# Patient Record
Sex: Female | Born: 1985 | Race: Black or African American | Hispanic: No | Marital: Single | Smoking: Current every day smoker
Health system: Southern US, Community
[De-identification: ages and names within clinical notes are randomized; demographics above are authoritative.]

## PROBLEM LIST (undated history)

## (undated) ENCOUNTER — Inpatient Hospital Stay (HOSPITAL_COMMUNITY): Payer: Self-pay

## (undated) DIAGNOSIS — L732 Hidradenitis suppurativa: Secondary | ICD-10-CM

## (undated) DIAGNOSIS — G43909 Migraine, unspecified, not intractable, without status migrainosus: Secondary | ICD-10-CM

## (undated) DIAGNOSIS — R87629 Unspecified abnormal cytological findings in specimens from vagina: Secondary | ICD-10-CM

## (undated) DIAGNOSIS — A749 Chlamydial infection, unspecified: Secondary | ICD-10-CM

## (undated) HISTORY — PX: NO PAST SURGERIES: SHX2092

## (undated) HISTORY — DX: Migraine, unspecified, not intractable, without status migrainosus: G43.909

## (undated) HISTORY — PX: TUBAL LIGATION: SHX77

---
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
## 2007-07-28 ENCOUNTER — Emergency Department (HOSPITAL_COMMUNITY): Admission: EM | Admit: 2007-07-28 | Discharge: 2007-07-28 | Payer: Self-pay | Admitting: Emergency Medicine

## 2007-07-31 ENCOUNTER — Emergency Department (HOSPITAL_COMMUNITY): Admission: EM | Admit: 2007-07-31 | Discharge: 2007-07-31 | Payer: Self-pay | Admitting: Emergency Medicine

## 2007-11-06 ENCOUNTER — Emergency Department (HOSPITAL_COMMUNITY): Admission: EM | Admit: 2007-11-06 | Discharge: 2007-11-06 | Payer: Self-pay | Admitting: Emergency Medicine

## 2008-09-02 ENCOUNTER — Emergency Department (HOSPITAL_COMMUNITY): Admission: EM | Admit: 2008-09-02 | Discharge: 2008-09-02 | Payer: Self-pay | Admitting: Emergency Medicine

## 2009-01-11 IMAGING — CR DG CHEST 2V
2 series · 2 of 2 positions shown · non-contrast
Comparison: None.

CLINICAL DATA: Chest pressure and shortness of breath.
 CHEST - 2 VIEW:

[view not recorded (1 of 2)]
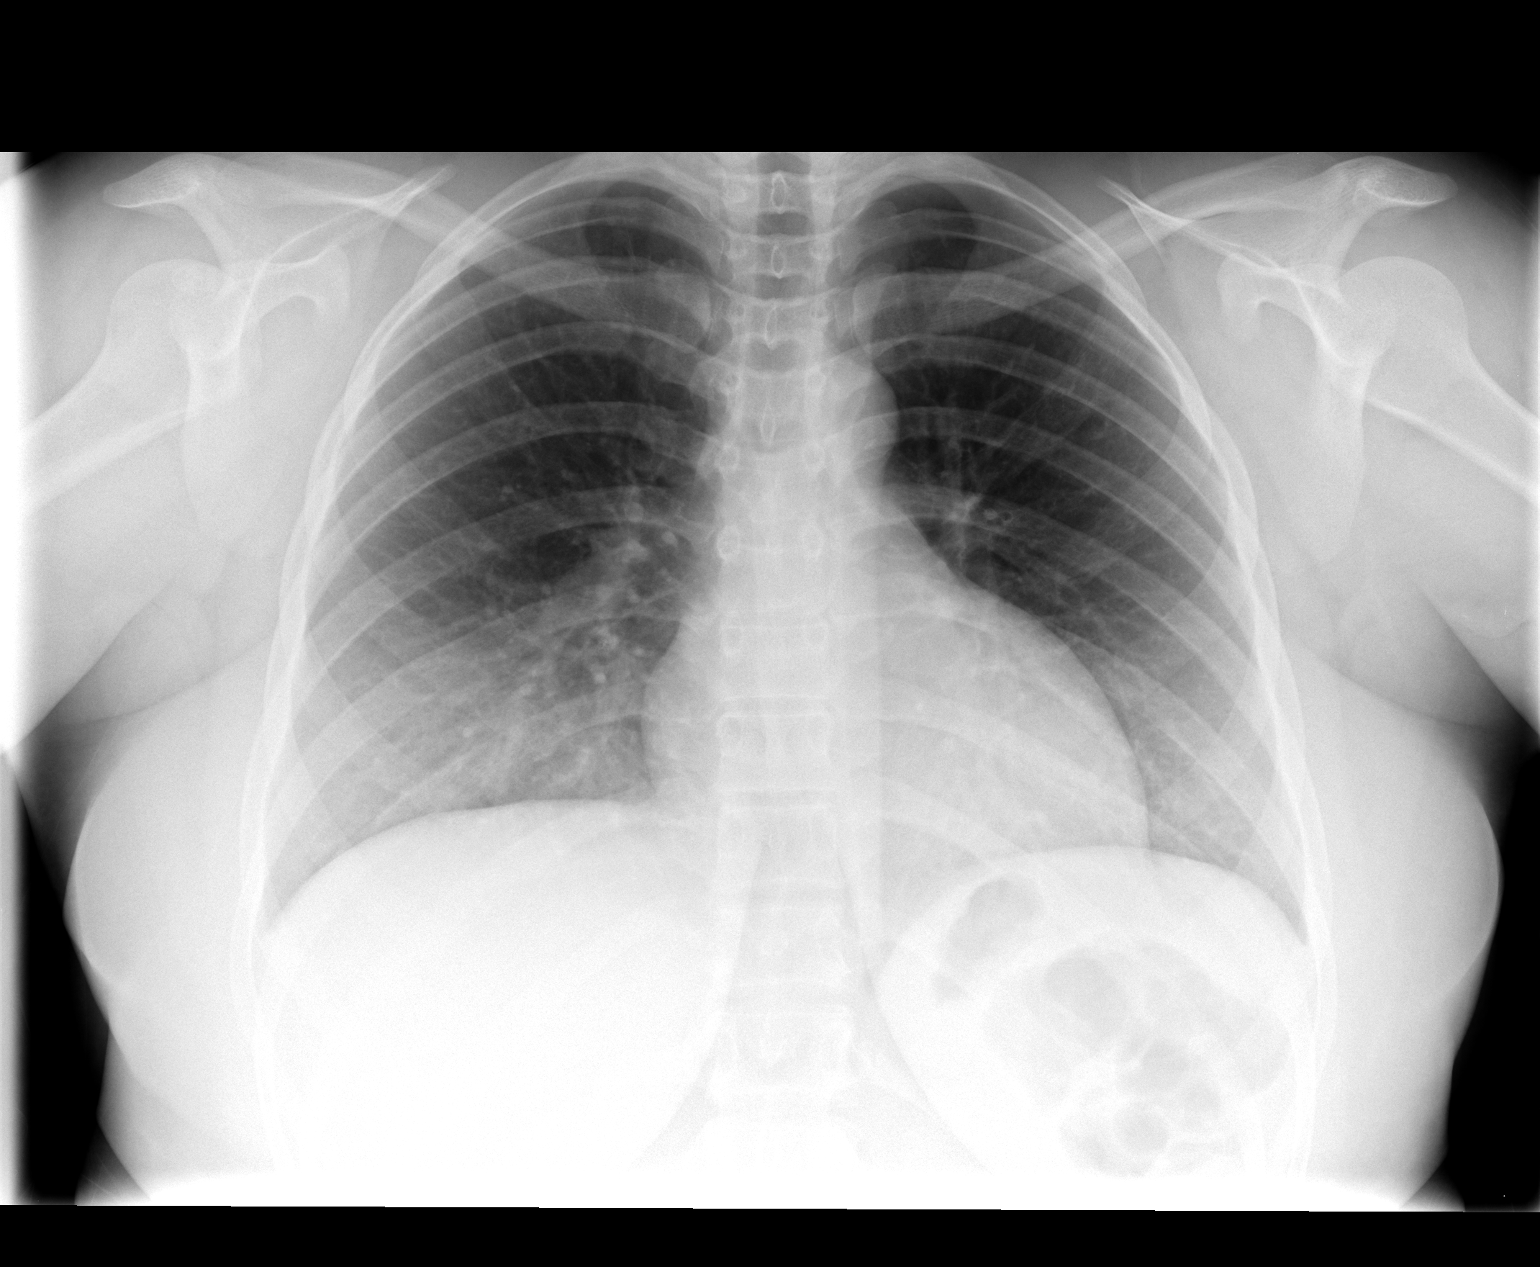

[view not recorded (2 of 2)]
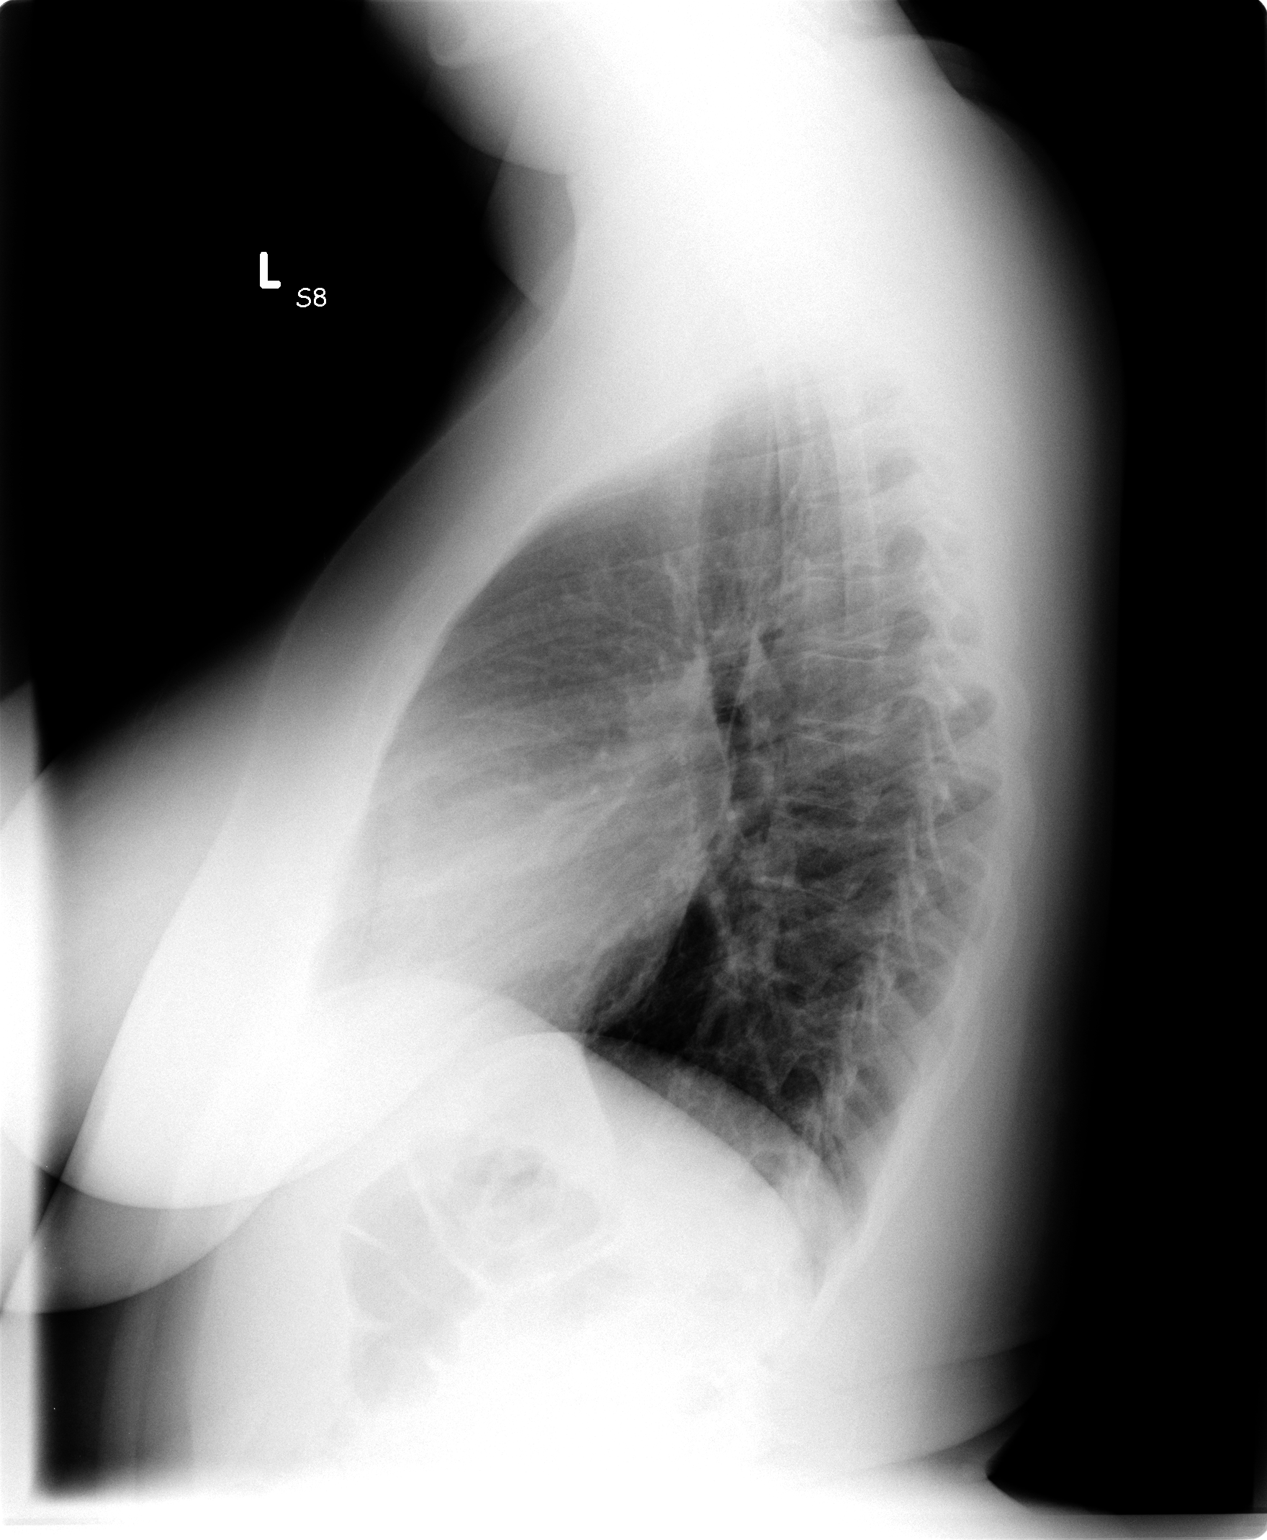

[2 of 2 positions shown; findings below may reference images not displayed]

FINDINGS: Relative low level of inspiration.  Heart and lungs normal.  No pleural fluid or osseous lesions.
IMPRESSION: No active disease.

## 2009-02-09 ENCOUNTER — Emergency Department (HOSPITAL_COMMUNITY): Admission: EM | Admit: 2009-02-09 | Discharge: 2009-02-09 | Payer: Self-pay | Admitting: Emergency Medicine

## 2009-03-16 ENCOUNTER — Emergency Department (HOSPITAL_COMMUNITY): Admission: EM | Admit: 2009-03-16 | Discharge: 2009-03-16 | Payer: Self-pay | Admitting: Emergency Medicine

## 2009-04-25 ENCOUNTER — Emergency Department (HOSPITAL_COMMUNITY): Admission: EM | Admit: 2009-04-25 | Discharge: 2009-04-25 | Payer: Self-pay | Admitting: Emergency Medicine

## 2010-02-22 ENCOUNTER — Emergency Department (HOSPITAL_COMMUNITY): Admission: EM | Admit: 2010-02-22 | Discharge: 2010-02-22 | Payer: Self-pay | Admitting: Emergency Medicine

## 2010-09-14 ENCOUNTER — Emergency Department (HOSPITAL_COMMUNITY)
Admission: EM | Admit: 2010-09-14 | Discharge: 2010-09-15 | Payer: Self-pay | Source: Home / Self Care | Admitting: Occupational Therapy

## 2010-11-18 ENCOUNTER — Inpatient Hospital Stay (INDEPENDENT_AMBULATORY_CARE_PROVIDER_SITE_OTHER)
Admission: RE | Admit: 2010-11-18 | Discharge: 2010-11-18 | Disposition: A | Discharge status: Home / Self Care | Payer: Self-pay | Source: Ambulatory Visit | Attending: Family Medicine | Admitting: Family Medicine

## 2010-11-18 DIAGNOSIS — J309 Allergic rhinitis, unspecified: Secondary | ICD-10-CM

## 2010-11-18 LAB — PREGNANCY, URINE: Preg Test, Ur: NEGATIVE

## 2010-11-18 LAB — WET PREP, GENITAL: Yeast Wet Prep HPF POC: NONE SEEN

## 2010-11-18 LAB — URINALYSIS, ROUTINE W REFLEX MICROSCOPIC
Glucose, UA: NEGATIVE mg/dL
Hgb urine dipstick: NEGATIVE
Ketones, ur: 15 mg/dL — AB
Nitrite: NEGATIVE
Protein, ur: NEGATIVE mg/dL
Specific Gravity, Urine: 1.036 — ABNORMAL HIGH (ref 1.005–1.030)
Urobilinogen, UA: 1 mg/dL (ref 0.0–1.0)

## 2010-11-18 LAB — GC/CHLAMYDIA PROBE AMP, GENITAL
Chlamydia, DNA Probe: NEGATIVE
GC Probe Amp, Genital: NEGATIVE

## 2010-12-10 LAB — RAPID STREP SCREEN (MED CTR MEBANE ONLY): Streptococcus, Group A Screen (Direct): NEGATIVE

## 2010-12-17 LAB — URINALYSIS, ROUTINE W REFLEX MICROSCOPIC
Ketones, ur: NEGATIVE mg/dL
Nitrite: NEGATIVE
Protein, ur: NEGATIVE mg/dL
Specific Gravity, Urine: 1.015 (ref 1.005–1.030)

## 2010-12-17 LAB — PREGNANCY, URINE: Preg Test, Ur: NEGATIVE

## 2010-12-17 LAB — WET PREP, GENITAL

## 2011-01-18 NOTE — Op Note (Signed)
NAME:  Christy Holden, Christy Holden NO.:  [id number]   MEDICAL RECORD NO.:  [id number]          PATIENT TYPE:  INP   LOCATION:  LDR2                          FACILITY:  [facility]   PHYSICIAN:  Lazaro Arms, M.D.   DATE OF BIRTH:  [birth year]   DATE OF PROCEDURE:  10/01/2006  DATE OF DISCHARGE:                               OPERATIVE REPORT   PROCEDURE:  Epidural placement.   SURGEON:  Lazaro Arms, M.D.   INDICATIONS FOR PROCEDURE:  Christy Holden is an 25 year old Gravida 1, para 0  who has experienced ruptured membranes without labor.  She has been  begun on Pitocin for the induction of labor.  Her cervix is now 4 cm and  she is requesting an epidural.   DESCRIPTION OF PROCEDURE:  The patient is placed in a sitting position.  Betadine prep is used.  The L3-L4 interspace is injected with 1%  lidocaine as a local anesthetic.  The area is still draped.  A #17 gauge  Tuohy needle is used and a loss of resistance technique employed, and  the epidural space is found with one pass, without difficulty.  Then 10  mL of 0.125% bupivacaine plain is given without difficulty and without  ill effects.  The epidural catheter is then fed, but it feds directly  into a blood vessel.  It is cleared and then backed up.  I re-fed it and  it fed into a blood vessel again.  As a result, I removed the #17 gauge  Tuohy needle once again and found the epidural space using a loss of  resistance technique with one pass.  I fed the epidural catheter this  time and it did not find a blood vessel.  I then gave 10 mL more of  0.125% bupivacaine through the epidural catheter, again without ill  effects.  The epidural catheter was taped down 5 cm from the epidural  space with continuous infusion of 0.125% bupivacaine with 2 mcg per mL  of fentanyl begun at 12 mL an hour.  The patient is getting good pain  relief.  The fetal heart rate tracing is stable.  The blood pressure is  stable.      Lazaro Arms,  M.D.  Electronically Signed     LHE/MEDQ  D:  10/01/2006  T:  10/01/2006  Job:  [id number]

## 2011-01-18 NOTE — Op Note (Signed)
NAME:  Christy Holden, Christy Holden NO.:  [id number]   MEDICAL RECORD NO.:  [id number]          PATIENT TYPE:  INP   LOCATION:  LDR2                          FACILITY:  [facility]   PHYSICIAN:  Lazaro Arms, M.D.   DATE OF BIRTH:  [birth year]   DATE OF PROCEDURE:  DATE OF DISCHARGE:                               OPERATIVE REPORT   Onset of labor was October 01, 2006, at 7 a.m.  Length of first stage  labor was 4 hours.  Length of second stage labor was 1 hour.  Length of  third stage labor was 5 minutes.   DELIVERY NOTE:  [name] had a normal spontaneous vaginal delivery of a  viable female infant with Apgars of 9 and 9.  Upon delivery of infant,  she was thoroughly suctioned, cord clamped and cut, and placed on  mother's abdomen for newborn care.  Third stage of labor was actually  managed with 20 units Pitocin 1000 mL D5 lactated Ringers at a rapid  rate.  Placenta was delivered spontaneously.  _membranes intact__.  Cord  blood gas and cord blood were obtained.  Membranes were noted to be  intact upon inspection.  Perineum was noted to be intact on inspection.  Estimated blood loss was approximately 350 mL.  Epidural catheter was  removed with blue tip intact.      Zerita Boers, N.M.      Lazaro Arms, M.D.  Electronically Signed    DL/MEDQ  D:  04/54/0981  T:  10/01/2006  Job:  [id number]

## 2011-01-18 NOTE — H&P (Signed)
NAME:  TARAYA, STEWARD NO.:  [id number]   MEDICAL RECORD NO.:  [id number]          PATIENT TYPE:  INP   LOCATION:  LDR2                          FACILITY:  [facility]   PHYSICIAN:  Lazaro Arms, M.D.   DATE OF BIRTH:  03-24-1986   DATE OF ADMISSION:  09/30/2006  DATE OF DISCHARGE:  LH                              HISTORY & PHYSICAL   CHIEF COMPLAINT:  Premature rupture of membranes.   HISTORY OF PRESENT ILLNESS:  Shell is an 25 year old gravida 1, para 0  with an EDC of October 18, 2006, placing her at 36 weeks 6 days  gestation.  She began prenatal care in her first trimester and has had  regular visits since then.  Her prenatal course was complicated by early  Chlamydia infection which was treated and proof of care was negative.   PRENATAL LABORATORIES:  Blood type O+, rubella immune.  HB, SAG, HIV,  RPR, HSV, gonorrhea and sickle cell are all negative.  She is positive  for group B strep.  MSAFP is within normal limits.  Blood pressures have  been anywhere from 100-150s/70s to 80s even in the first trimester.  Her  blood pressures here in the hospital are in the 140/80 range.  She has  had a total weight gain of about 20 pounds with appropriate fundal  height growth.   PAST MEDICAL HISTORY:  Noncontributory.   SURGICAL HISTORY:  None.   ALLERGIES:  No known drug allergies.   SOCIAL HISTORY:  Denies cigarette smoking, alcohol or drug use.  UDS was  negative.  She is single, graduated from [employer] and  lives with her aunt.   FAMILY HISTORY:  Noncontributory.   PHYSICAL EXAMINATION:  HEENT: Within normal limits.  HEART: Regular rate and rhythm.  LUNGS:  Clear.  ABDOMEN:  Soft and nontender.  She is having some mild and infrequent  uterine contractions at this time.  Her fetal heart rate is reactive  without decelerations.  Cervical exam is 3, 50, -2, clear amniotic  fluid.  Legs are negative.   IMPRESSION:  IUP at 37 weeks, premature  rupture of membranes.   PLAN:  We have already started group B strep prophylaxis.  If she was  not kick into good labor by the morning, will start on Pitocin  augmentation.      Jacklyn Shell, C.N.M.      Lazaro Arms, M.D.  Electronically Signed    FC/MEDQ  D:  09/30/2006  T:  10/01/2006  Job:  [id number]   cc:   [facility]   Scott A. Gerda Diss, MD  Fax: [phone]

## 2011-01-26 ENCOUNTER — Emergency Department (HOSPITAL_COMMUNITY): Payer: Self-pay

## 2011-01-26 ENCOUNTER — Emergency Department (HOSPITAL_COMMUNITY)
Admission: EM | Admit: 2011-01-26 | Discharge: 2011-01-27 | Disposition: A | Discharge status: Home / Self Care | Payer: Self-pay | Attending: Emergency Medicine | Admitting: Emergency Medicine

## 2011-01-26 DIAGNOSIS — M25579 Pain in unspecified ankle and joints of unspecified foot: Secondary | ICD-10-CM | POA: Insufficient documentation

## 2011-01-26 DIAGNOSIS — X58XXXA Exposure to other specified factors, initial encounter: Secondary | ICD-10-CM | POA: Insufficient documentation

## 2011-01-26 DIAGNOSIS — Y9302 Activity, running: Secondary | ICD-10-CM | POA: Insufficient documentation

## 2011-01-26 DIAGNOSIS — S93409A Sprain of unspecified ligament of unspecified ankle, initial encounter: Secondary | ICD-10-CM | POA: Insufficient documentation

## 2011-06-11 LAB — DIFFERENTIAL
Basophils Absolute: 0
Eosinophils Absolute: 0.5
Eosinophils Relative: 7 — ABNORMAL HIGH
Lymphocytes Relative: 32
Lymphs Abs: 2.3
Monocytes Relative: 7
Neutro Abs: 3.9
Neutrophils Relative %: 54

## 2011-06-11 LAB — CBC
MCV: 87.5
Platelets: 342

## 2011-06-11 LAB — D-DIMER, QUANTITATIVE: D-Dimer, Quant: 0.22

## 2012-11-10 ENCOUNTER — Emergency Department (HOSPITAL_COMMUNITY)
Admission: EM | Admit: 2012-11-10 | Discharge: 2012-11-10 | Disposition: A | Discharge status: Home / Self Care | Payer: Self-pay | Attending: Emergency Medicine | Admitting: Emergency Medicine

## 2012-11-10 ENCOUNTER — Encounter (HOSPITAL_COMMUNITY): Payer: Self-pay | Admitting: Physical Medicine and Rehabilitation

## 2012-11-10 DIAGNOSIS — R21 Rash and other nonspecific skin eruption: Secondary | ICD-10-CM | POA: Insufficient documentation

## 2012-11-10 DIAGNOSIS — L299 Pruritus, unspecified: Secondary | ICD-10-CM | POA: Insufficient documentation

## 2012-11-10 DIAGNOSIS — F17200 Nicotine dependence, unspecified, uncomplicated: Secondary | ICD-10-CM | POA: Insufficient documentation

## 2012-11-10 DIAGNOSIS — L282 Other prurigo: Secondary | ICD-10-CM

## 2012-11-10 NOTE — ED Notes (Signed)
Pt presents to department for evaluation of rash. Small red areas all over body. Pt states she noticed these after sleeping on friends couch. Pt states severe itching to red areas. Pt is alert and oriented x4. Respirations unlabored.

## 2012-11-10 NOTE — ED Provider Notes (Signed)
History     CSN: [id number]  Arrival date & time 11/10/12  1822   None     Chief Complaint  Patient presents with  . Rash    (Consider location/radiation/quality/duration/timing/severity/associated sxs/prior treatment) Patient is a 27 y.o. female presenting with rash. The history is provided by the patient. No language interpreter was used.  Rash Location:  Full body Quality: itchiness and redness   Quality: not blistering, not draining, not scaling and not weeping   Severity:  Moderate Onset quality:  Sudden Duration:  1 day Timing:  Intermittent Progression:  Waxing and waning Chronicity:  New Worsened by:  Nothing tried Ineffective treatments:  None tried   No past medical history on file.  No past surgical history on file.  No family history on file.  History  Substance Use Topics  . Smoking status: Current Every Day Smoker    Types: Cigarettes  . Smokeless tobacco: Not on file  . Alcohol Use: No    OB History   Grav Para Term Preterm Abortions TAB SAB Ect Mult Living                  Review of Systems  Skin: Positive for rash.  All other systems reviewed and are negative.    Allergies  Review of patient's allergies indicates no known allergies.  Home Medications  No current outpatient prescriptions on file.  BP 135/78  Pulse 79  Temp(Src) 98.9 F (37.2 C) (Oral)  Resp 16  SpO2 100%  Physical Exam  Nursing note and vitals reviewed. Constitutional: She is oriented to person, place, and time. She appears well-developed and well-nourished.  HENT:  Head: Normocephalic and atraumatic.  Eyes: Pupils are equal, round, and reactive to light.  Neck: Normal range of motion.  Cardiovascular: Normal rate.   Pulmonary/Chest: Effort normal and breath sounds normal.  Abdominal: Soft. Bowel sounds are normal.  Musculoskeletal: Normal range of motion.  Neurological: She is alert and oriented to person, place, and time.  Skin: Skin is warm and dry.  Rash noted.  Several areas of red, pruritic rash on arms, legs, left foot--suspect insect bites.  Psychiatric: She has a normal mood and affect. Her behavior is normal. Judgment and thought content normal.    ED Course  Procedures (including critical care time)  Labs Reviewed - No data to display No results found.   No diagnosis found.  Pruritic rash.  Benadryl and hydrocortisone cream.  MDM          Jimmye Norman, NP 11/11/12 0007

## 2012-11-12 NOTE — ED Provider Notes (Signed)
Medical screening examination/treatment/procedure(s) were performed by non-physician practitioner and as supervising physician I was immediately available for consultation/collaboration.  Raeford Razor, MD 11/12/12 [phone]

## 2013-05-25 ENCOUNTER — Emergency Department (INDEPENDENT_AMBULATORY_CARE_PROVIDER_SITE_OTHER)
Admission: EM | Admit: 2013-05-25 | Discharge: 2013-05-25 | Disposition: A | Discharge status: Home / Self Care | Payer: 59 | Source: Home / Self Care

## 2013-05-25 ENCOUNTER — Other Ambulatory Visit (HOSPITAL_COMMUNITY)
Admission: RE | Admit: 2013-05-25 | Discharge: 2013-05-25 | Disposition: A | Discharge status: Home / Self Care | Payer: 59 | Source: Ambulatory Visit | Attending: Emergency Medicine | Admitting: Emergency Medicine

## 2013-05-25 ENCOUNTER — Encounter (HOSPITAL_COMMUNITY): Payer: Self-pay | Admitting: Emergency Medicine

## 2013-05-25 DIAGNOSIS — N730 Acute parametritis and pelvic cellulitis: Secondary | ICD-10-CM

## 2013-05-25 DIAGNOSIS — Z113 Encounter for screening for infections with a predominantly sexual mode of transmission: Secondary | ICD-10-CM | POA: Insufficient documentation

## 2013-05-25 DIAGNOSIS — N760 Acute vaginitis: Secondary | ICD-10-CM | POA: Insufficient documentation

## 2013-05-25 DIAGNOSIS — K5900 Constipation, unspecified: Secondary | ICD-10-CM

## 2013-05-25 DIAGNOSIS — R102 Pelvic and perineal pain: Secondary | ICD-10-CM

## 2013-05-25 DIAGNOSIS — N949 Unspecified condition associated with female genital organs and menstrual cycle: Secondary | ICD-10-CM

## 2013-05-25 LAB — POCT URINALYSIS DIP (DEVICE)
Glucose, UA: NEGATIVE mg/dL
Specific Gravity, Urine: 1.015 (ref 1.005–1.030)
Urobilinogen, UA: 0.2 mg/dL (ref 0.0–1.0)
pH: 7 (ref 5.0–8.0)

## 2013-05-25 MED ORDER — METRONIDAZOLE 500 MG PO TABS: 500.0000 mg | ORAL_TABLET | Freq: Two times a day (BID) | ORAL | Status: DC

## 2013-05-25 MED ORDER — LIDOCAINE HCL (PF) 1 % IJ SOLN
INTRAMUSCULAR | Status: AC
  Filled 2013-05-25: qty 5

## 2013-05-25 MED ORDER — CEFTRIAXONE SODIUM 1 G IJ SOLR
INTRAMUSCULAR | Status: AC
  Filled 2013-05-25: qty 10

## 2013-05-25 MED ORDER — CEFTRIAXONE SODIUM 250 MG IJ SOLR
250.0000 mg | Freq: Once | INTRAMUSCULAR | Status: AC
  Administered 2013-05-25: 250 mg via INTRAMUSCULAR

## 2013-05-25 MED ORDER — AZITHROMYCIN 250 MG PO TABS: ORAL_TABLET | ORAL | Status: DC

## 2013-05-25 NOTE — ED Notes (Signed)
C/o lower abdominal pain since 9/21.  Denies urinary symptoms and discharge.  Pt states that she has not had a BM in the past three days.  Last stool was normal.   Denies any other symptoms.

## 2013-05-25 NOTE — ED Provider Notes (Signed)
Medical screening examination/treatment/procedure(s) were performed by non-physician practitioner and as supervising physician I was immediately available for consultation/collaboration.  Jeffrey Graefe, M.D.  Raiford Fetterman C Piercen Covino, MD 05/25/13 1953 

## 2013-05-25 NOTE — ED Provider Notes (Signed)
CSN: [id number]     Arrival date & time 05/25/13  1722 History   First MD Initiated Contact with Patient 05/25/13 1755     Chief Complaint  Patient presents with  . Abdominal Pain    lower abdominal pain since 9/21   (Consider location/radiation/quality/duration/timing/severity/associated sxs/prior Treatment) HPI Comments: -year-old female presents with low abdominal/pelvic pain described as crampy for at least 2 days. She states the pain is sharp, comes and goes and is episodic. It is not constant. She states that since she quit smoking 3 weeks ago that she is developed constipation. She does not have regular bowel movements as she used to. She denies vaginal discharge or bleeding, urinary symptoms, nausea, vomiting, diarrhea. Last menstrual period was one week ago but she states that she is managed with Implanon and often does not have regular periods.   History reviewed. No pertinent past medical history. History reviewed. No pertinent past surgical history. History reviewed. No pertinent family history. History  Substance Use Topics  . Smoking status: Current Every Day Smoker    Types: Cigarettes  . Smokeless tobacco: Not on file  . Alcohol Use: No   OB History   Grav Para Term Preterm Abortions TAB SAB Ect Mult Living                 Review of Systems  Constitutional: Positive for appetite change. Negative for fever, activity change and fatigue.  HENT: Negative.   Respiratory: Negative.   Cardiovascular: Negative.   Gastrointestinal: Positive for abdominal pain and constipation. Negative for nausea, vomiting, diarrhea, blood in stool and abdominal distention.  Genitourinary: Negative for dysuria, urgency, frequency, flank pain, vaginal bleeding, vaginal discharge and vaginal pain.  Musculoskeletal: Negative.   Skin: Negative.   Neurological: Negative.     Allergies  Review of patient's allergies indicates no known allergies.  Home Medications   Current Outpatient Rx   Name  Route  Sig  Dispense  Refill  . azithromycin (ZITHROMAX) 250 MG tablet      Take all 4 tabs stat   4 tablet   0   . etonogestrel (IMPLANON) 68 MG IMPL implant   Subcutaneous   Inject 1 each into the skin once.         . metroNIDAZOLE (FLAGYL) 500 MG tablet   Oral   Take 1 tablet (500 mg total) by mouth 2 (two) times daily. X 7 days   14 tablet   0    BP 128/80  Pulse 72  Temp(Src) 99.1 F (37.3 C) (Oral)  Resp 16  SpO2 99% Physical Exam  Nursing note and vitals reviewed. Constitutional: She is oriented to person, place, and time. She appears well-developed and well-nourished. No distress.  Eyes: EOM are normal.  Neck: Normal range of motion. Neck supple.  Cardiovascular: Normal rate, regular rhythm, normal heart sounds and intact distal pulses.   Pulmonary/Chest: Effort normal and breath sounds normal. No respiratory distress. She has no wheezes. She has no rales.  Abdominal: Soft. She exhibits no distension and no mass. There is tenderness. There is no rebound and no guarding.  Minor tenderness across the lower most abdomen. Tenderness in the right and left pelvis.  Genitourinary: Vaginal discharge found.  Normal external female genitalia the exception of moderate amount of bradycardia discharge exuding from the vagina. The vaginal vault is filled with thin gray discharge. Cervix was captured it is left of midline and anterior. The ectocervix is erythematous , friable and inflamed. No CMT, positive  right adnexal tenderness, negative left adnexal tenderness. A second evaluation of the lower abdomen reveals no abdominal tenderness.   Musculoskeletal: She exhibits no edema and no tenderness.  Neurological: She is alert and oriented to person, place, and time. She exhibits normal muscle tone.  Skin: Skin is warm and dry.  Psychiatric: She has a normal mood and affect.    ED Course  Procedures (including critical care time) Labs Review Labs Reviewed  POCT  URINALYSIS DIP (DEVICE) - Abnormal; Notable for the following:    Hgb urine dipstick TRACE (*)    Leukocytes, UA TRACE (*)    All other components within normal limits  POCT PREGNANCY, URINE  CERVICOVAGINAL ANCILLARY ONLY   Imaging Review No results found. Results for orders placed during the hospital encounter of 05/25/13  POCT URINALYSIS DIP (DEVICE)      Result Value Range   Glucose, UA NEGATIVE  NEGATIVE mg/dL   Bilirubin Urine NEGATIVE  NEGATIVE   Ketones, ur NEGATIVE  NEGATIVE mg/dL   Specific Gravity, Urine 1.015  1.005 - 1.030   Hgb urine dipstick TRACE (*) NEGATIVE   pH 7.0  5.0 - 8.0   Protein, ur NEGATIVE  NEGATIVE mg/dL   Urobilinogen, UA 0.2  0.0 - 1.0 mg/dL   Nitrite NEGATIVE  NEGATIVE   Leukocytes, UA TRACE (*) NEGATIVE  POCT PREGNANCY, URINE      Result Value Range   Preg Test, Ur NEGATIVE  NEGATIVE    MDM   1. PID (acute pelvic inflammatory disease)   2. Constipation   3. Pelvic pain     For potential constipation may take MiraLax as directed. Drink plenty of fluids and increase fiber in your diet. For  the pelvic infection administer Rocephin 250 mg IM Rx for azithromycin 1 g by mouth Rx for Flagyl 500 mg twice a day. The discharge is consistent with BV.  Hayden Rasmussen, NP 05/25/13 [phone]

## 2013-05-25 NOTE — ED Notes (Signed)
Pt given injection will discharge at 7:10 p.m

## 2013-06-02 NOTE — ED Notes (Signed)
GC/Chlamydia neg., Affirm: Candida and Trich neg., Gardnerella pos.  Pt. adequately treated with Flagyl. Priscilla Finklea M 06/02/2013  

## 2013-06-04 ENCOUNTER — Encounter: Payer: Self-pay | Admitting: Family Medicine

## 2013-06-04 ENCOUNTER — Ambulatory Visit (INDEPENDENT_AMBULATORY_CARE_PROVIDER_SITE_OTHER): Payer: 59 | Admitting: Family Medicine

## 2013-06-04 VITALS — BP 120/80 | HR 68 | Temp 97.2°F | Resp 18 | Ht 64.0 in | Wt 195.0 lb

## 2013-06-04 DIAGNOSIS — B9689 Other specified bacterial agents as the cause of diseases classified elsewhere: Secondary | ICD-10-CM

## 2013-06-04 DIAGNOSIS — Z23 Encounter for immunization: Secondary | ICD-10-CM

## 2013-06-04 DIAGNOSIS — N760 Acute vaginitis: Secondary | ICD-10-CM

## 2013-06-04 DIAGNOSIS — A499 Bacterial infection, unspecified: Secondary | ICD-10-CM

## 2013-06-04 DIAGNOSIS — Z0000 Encounter for general adult medical examination without abnormal findings: Secondary | ICD-10-CM

## 2013-06-04 DIAGNOSIS — E669 Obesity, unspecified: Secondary | ICD-10-CM

## 2013-06-04 NOTE — Patient Instructions (Addendum)
Release of [facility]- Need last PAP Smear included  Get the flagyl and complete course  Tetanus Booster and FLU  F/U as needed

## 2013-06-04 NOTE — Progress Notes (Signed)
  Subjective:    Patient ID: Christy Holden, female    DOB: 1985/09/29, 27 y.o.   MRN: [id number]  HPI  Pt here to establish care, previous PCP [facility].  No specific concerns, had  PAP and fasting labs this year in March told everything was normal Recently seen at [facility], had pelvic pain diagnosed with PID, cultures neg with exception of BV, she never took the flagyl still has script in care. Plans to enter [employer], she has been trying to lose weight. Works out a few times a week, also cutting back on fastfood and junk food  OCP- has implanon in her arm, she is having this taken out, when she had previously she gained 40 pounds, she wants to see if removing it will help with weight loss again   Review of Systems   GEN- denies fatigue, fever, weight loss,weakness, recent illness HEENT- denies eye drainage, change in vision, nasal discharge, CVS- denies chest pain, palpitations RESP- denies SOB, cough, wheeze ABD- denies N/V, change in stools, abd pain GU- denies dysuria, hematuria, dribbling, incontinence MSK- denies joint pain, muscle aches, injury Neuro- denies headache, dizziness, syncope, seizure activity      Objective:   Physical Exam GEN- NAD, alert and oriented x3 HEENT- PERRL, EOMI, non injected sclera, pink conjunctiva, MMM, oropharynx clear, TM bilaterally  Neck- Supple, no thyromegaly CVS- RRR, no murmur RESP-CTAB ABD-NABS,NT,ND, Soft EXT- No edema Pulses- Radial, DP- 2+        Assessment & Plan:   CPE- Will obtain fasting labs and last PAP smear for health department.  Flu and TDAP given today

## 2013-06-06 ENCOUNTER — Encounter: Payer: Self-pay | Admitting: Family Medicine

## 2013-06-06 DIAGNOSIS — E669 Obesity, unspecified: Secondary | ICD-10-CM | POA: Insufficient documentation

## 2013-06-06 DIAGNOSIS — B9689 Other specified bacterial agents as the cause of diseases classified elsewhere: Secondary | ICD-10-CM | POA: Insufficient documentation

## 2013-06-06 NOTE — Assessment & Plan Note (Signed)
Recent diagnosis advised to start the flagyl

## 2013-07-05 ENCOUNTER — Encounter (HOSPITAL_COMMUNITY): Payer: Self-pay | Admitting: Emergency Medicine

## 2013-07-05 ENCOUNTER — Emergency Department (HOSPITAL_COMMUNITY)
Admission: EM | Admit: 2013-07-05 | Discharge: 2013-07-06 | Disposition: A | Discharge status: Home / Self Care | Payer: 59 | Attending: Emergency Medicine | Admitting: Emergency Medicine

## 2013-07-05 DIAGNOSIS — I10 Essential (primary) hypertension: Secondary | ICD-10-CM | POA: Insufficient documentation

## 2013-07-05 DIAGNOSIS — G44209 Tension-type headache, unspecified, not intractable: Secondary | ICD-10-CM | POA: Insufficient documentation

## 2013-07-05 DIAGNOSIS — Z87891 Personal history of nicotine dependence: Secondary | ICD-10-CM | POA: Insufficient documentation

## 2013-07-05 MED ORDER — IBUPROFEN 600 MG PO TABS: 600.0000 mg | ORAL_TABLET | Freq: Four times a day (QID) | ORAL | Status: DC | PRN

## 2013-07-05 MED ORDER — TRAMADOL HCL 50 MG PO TABS: 50.0000 mg | ORAL_TABLET | Freq: Four times a day (QID) | ORAL | Status: DC | PRN

## 2013-07-05 NOTE — ED Provider Notes (Signed)
CSN: [id number]     Arrival date & time 07/05/13  2244 History   First MD Initiated Contact with Patient 07/05/13 2339     Chief Complaint  Patient presents with  . Migraine   (Consider location/radiation/quality/duration/timing/severity/associated sxs/prior Treatment) HPI This patient is a generally healthy young woman who presents with complaints of headache for the past 3-4 weeks. Her headache seemed on in the evenings, after she leaves work. Headaches are mainly right-sided. Mild to moderate in severity. Patient told her father about her headaches today. He told her to check her blood pressure. The patient found that her blood pressure was elevated at 140/90. She has no history of HTN. Her father told her she needed to come to the ED to get checked out.   The patient currently has a mild headache which he describes as 3/10 in severity. She denies any associated photophobia, phonophobia, nausea, vomiting or focal neurologic deficits. No visual changes. Headache seems to radiate from back to front. The patient does not have daily headaches but, estimates that she has headaches about 5 days out of 7. She works in a [employer] and stands during her shift.   History reviewed. No pertinent past medical history. History reviewed. No pertinent past surgical history. Family History  Problem Relation Age of Onset  . Hypertension Mother   . Hypertension Father   . Kidney disease Maternal Grandmother    History  Substance Use Topics  . Smoking status: Former Smoker    Types: Cigarettes  . Smokeless tobacco: Not on file  . Alcohol Use: No   OB History   Grav Para Term Preterm Abortions TAB SAB Ect Mult Living                 Review of Systems 10 point ROS performed and is negative with the exception of symptoms noted above.  Allergies  Review of patient's allergies indicates no known allergies.  Home Medications  No current outpatient prescriptions on file. BP 145/89   Pulse 76  Temp(Src) 98.7 F (37.1 C) (Oral)  Resp 17  Wt 199 lb 6 oz (90.436 kg)  SpO2 99%  LMP 06/04/2013 Physical Exam Gen: well developed and well nourished appearing, no acute distress Head: NCAT Eyes: PERL, EOMI normal funduscopic exam Nose: no epistaixis or rhinorrhea Mouth/throat: mucosa is moist and pink Neck: supple, no stridor, no meningeal signs, ttp over the trapezius and scalene musculture on the right with palpable muscle tension Lungs: CTA B, no wheezing, rhonchi or rales CV: RRR, no murmur Abd: soft, notender, nondistended, obese. Back: no ttp, no cva ttp Skin: warm and dry Neuro: CN ii-xii grossly intact, no focal deficits, normal finger to nose, normal speech, normal gait Ext: normal to inspection Psyche; normal affect,  calm and cooperative.   ED Course  Procedures (including critical care time) Labs Review   MDM  Patient with tension headache and incidental finding of mildly elevated BP. I have counseled her regarding the importance of outpatient follow up with PCP and monitoring of BP levels. I have counseled re: diagnosis of tension headache and plan for symptomatic management. Stretching excersizes also recommended.    Brandt Loosen, MD 07/06/13 0001

## 2013-07-05 NOTE — ED Notes (Signed)
Patient presents today with a chief complaint of unilateral (right) headache intermittently x 1 month. Patient reports taking iburprofen, BC powder without relief. Patient reports pain worsened tonight. Patient reports photosensitivity and nausea, denies auras

## 2013-07-05 NOTE — ED Notes (Signed)
Pt c/o heartburn/epigastric pain. she can feel the reflux in her throat and feels like she could maybe vomit but has nothing to vomit.    H/a started 3-4 weeks ago and has one everyday. Pain behind the eye on the right side, feels throbbing.  Headache comes and go, usually aleve or BC powder with relief but h/a comes back.

## 2013-07-06 ENCOUNTER — Telehealth: Payer: Self-pay | Admitting: Family Medicine

## 2013-07-06 NOTE — Telephone Encounter (Signed)
Patient would like to let you know that she went to the ED last night and they said that her BP was up . They told her to make and appointment to have it checked on a regular basis .

## 2013-07-07 NOTE — Telephone Encounter (Signed)
Pt has appt set up for Friday at 245

## 2013-07-09 ENCOUNTER — Ambulatory Visit (INDEPENDENT_AMBULATORY_CARE_PROVIDER_SITE_OTHER): Payer: 59 | Admitting: Family Medicine

## 2013-07-09 ENCOUNTER — Encounter: Payer: Self-pay | Admitting: Family Medicine

## 2013-07-09 VITALS — BP 110/70 | HR 86 | Temp 98.5°F | Resp 18 | Ht 64.0 in | Wt 195.0 lb

## 2013-07-09 DIAGNOSIS — R030 Elevated blood-pressure reading, without diagnosis of hypertension: Secondary | ICD-10-CM

## 2013-07-09 DIAGNOSIS — Z309 Encounter for contraceptive management, unspecified: Secondary | ICD-10-CM

## 2013-07-09 DIAGNOSIS — G43909 Migraine, unspecified, not intractable, without status migrainosus: Secondary | ICD-10-CM

## 2013-07-09 DIAGNOSIS — IMO0001 Reserved for inherently not codable concepts without codable children: Secondary | ICD-10-CM

## 2013-07-09 DIAGNOSIS — Z3490 Encounter for supervision of normal pregnancy, unspecified, unspecified trimester: Secondary | ICD-10-CM

## 2013-07-09 LAB — PREGNANCY, URINE: Preg Test, Ur: NEGATIVE

## 2013-07-09 MED ORDER — TOPIRAMATE 25 MG PO CPSP: 50.0000 mg | ORAL_CAPSULE | Freq: Every day | ORAL | Status: DC

## 2013-07-09 NOTE — Patient Instructions (Signed)
Start topamax 1 tablet at bedtime for 1 week, then increase to 2 tablets Okay to use ibuprofen once a day if needed while topamax gets in your system Referral for GYN for nexplanon placement  F/U 6 weeks

## 2013-07-11 ENCOUNTER — Encounter: Payer: Self-pay | Admitting: Family Medicine

## 2013-07-11 DIAGNOSIS — Z309 Encounter for contraceptive management, unspecified: Secondary | ICD-10-CM | POA: Insufficient documentation

## 2013-07-11 DIAGNOSIS — G43909 Migraine, unspecified, not intractable, without status migrainosus: Secondary | ICD-10-CM | POA: Insufficient documentation

## 2013-07-11 DIAGNOSIS — IMO0001 Reserved for inherently not codable concepts without codable children: Secondary | ICD-10-CM | POA: Insufficient documentation

## 2013-07-11 HISTORY — DX: Migraine, unspecified, not intractable, without status migrainosus: G43.909

## 2013-07-11 NOTE — Assessment & Plan Note (Signed)
Refer to GYN for placement of Nexplanon u PREG NEG Advised to repeat at home in 1 week if no menses

## 2013-07-11 NOTE — Assessment & Plan Note (Signed)
Repeat bP looks good, will monitor
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
## 2013-07-11 NOTE — Assessment & Plan Note (Signed)
Start topamax at bedtime Continue ibuprofen as needed  D/c ultram

## 2013-07-19 ENCOUNTER — Encounter: Payer: 59 | Admitting: Obstetrics and Gynecology

## 2013-08-06 ENCOUNTER — Encounter: Payer: 59 | Admitting: Adult Health

## 2013-08-06 ENCOUNTER — Encounter: Payer: Self-pay | Admitting: *Deleted

## 2013-08-27 ENCOUNTER — Encounter (HOSPITAL_COMMUNITY): Payer: Self-pay | Admitting: Emergency Medicine

## 2013-08-27 ENCOUNTER — Emergency Department (HOSPITAL_COMMUNITY)
Admission: EM | Admit: 2013-08-27 | Discharge: 2013-08-27 | Discharge status: Against Medical Advice | Payer: No Typology Code available for payment source | Attending: Emergency Medicine | Admitting: Emergency Medicine

## 2013-08-27 DIAGNOSIS — Z3201 Encounter for pregnancy test, result positive: Secondary | ICD-10-CM | POA: Insufficient documentation

## 2013-08-27 DIAGNOSIS — IMO0002 Reserved for concepts with insufficient information to code with codable children: Secondary | ICD-10-CM | POA: Insufficient documentation

## 2013-08-27 DIAGNOSIS — O9989 Other specified diseases and conditions complicating pregnancy, childbirth and the puerperium: Secondary | ICD-10-CM | POA: Insufficient documentation

## 2013-08-27 DIAGNOSIS — Y92410 Unspecified street and highway as the place of occurrence of the external cause: Secondary | ICD-10-CM | POA: Insufficient documentation

## 2013-08-27 DIAGNOSIS — Y9389 Activity, other specified: Secondary | ICD-10-CM | POA: Insufficient documentation

## 2013-08-27 LAB — POCT PREGNANCY, URINE: Preg Test, Ur: POSITIVE — AB

## 2013-08-27 MED ORDER — ACETAMINOPHEN 325 MG PO TABS
650.0000 mg | ORAL_TABLET | ORAL | Status: AC
  Administered 2013-08-27: 650 mg via ORAL
  Filled 2013-08-27: qty 2

## 2013-08-27 MED ORDER — ONDANSETRON 4 MG PO TBDP
4.0000 mg | ORAL_TABLET | ORAL | Status: AC
  Administered 2013-08-27: 4 mg via ORAL
  Filled 2013-08-27: qty 1

## 2013-08-27 NOTE — ED Notes (Signed)
Pt requesting to leave AMA. States that she is tired and just wants to go home. States that she will come back if she feels worse. FNP made aware of pt's request to leave AMA.
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
## 2013-10-29 ENCOUNTER — Encounter: Payer: Self-pay | Admitting: Family Medicine

## 2013-10-29 ENCOUNTER — Ambulatory Visit: Payer: 59 | Admitting: Family Medicine

## 2013-10-29 ENCOUNTER — Ambulatory Visit (INDEPENDENT_AMBULATORY_CARE_PROVIDER_SITE_OTHER): Payer: 59 | Admitting: Family Medicine

## 2013-10-29 ENCOUNTER — Other Ambulatory Visit: Payer: Self-pay | Admitting: Family Medicine

## 2013-10-29 VITALS — BP 138/76 | HR 72 | Temp 98.3°F | Resp 18 | Ht 63.5 in | Wt 200.0 lb

## 2013-10-29 DIAGNOSIS — A499 Bacterial infection, unspecified: Secondary | ICD-10-CM

## 2013-10-29 DIAGNOSIS — N760 Acute vaginitis: Secondary | ICD-10-CM

## 2013-10-29 DIAGNOSIS — B9689 Other specified bacterial agents as the cause of diseases classified elsewhere: Secondary | ICD-10-CM

## 2013-10-29 LAB — WET PREP FOR TRICH, YEAST, CLUE: TRICH WET PREP: NONE SEEN

## 2013-10-29 MED ORDER — METRONIDAZOLE 500 MG PO TABS: 500.0000 mg | ORAL_TABLET | Freq: Two times a day (BID) | ORAL | Status: DC

## 2013-10-29 MED ORDER — FLUCONAZOLE 150 MG PO TABS: 150.0000 mg | ORAL_TABLET | Freq: Once | ORAL | Status: DC

## 2013-10-29 NOTE — Progress Notes (Signed)
Patient ID: Christy Holden, female   DOB: 1986/07/30, 28 y.o.   MRN: [id number]   Subjective:    Patient ID: Christy AlaEbony S Proudfoot, female    DOB: 1986/07/30, 28 y.o.   MRN: [id number]  Patient presents for Infection, possible bacterial  Vaginal discharge x 3 weeks, mild odor, no abd pain, no dysuria, no vaginal bleeding. LMP 5 weeks ago, had abortion 5 weeks ago at a clinic in [place] has not had menses since then History of BV   Review Of Systems:  GEN- denies fatigue, fever, weight loss,weakness, recent illness ABD- denies N/V, change in stools, abd pain GU- denies dysuria, hematuria, dribbling, incontinence      Objective:    BP 138/76  Pulse 72  Temp(Src) 98.3 F (36.8 C)  Resp 18  Ht 5' 3.5" (1.613 m)  Wt 200 lb (90.719 kg)  BMI 34.87 kg/m2 GEN- NAD, alert and oriented x3 GU- normal external genitalia, vaginal mucosa pink and moist, cervix visualized no growth, no blood form os,+clear discharge, no CMT, no ovarian masses, uterus normal size         Assessment & Plan:      Problem List Items Addressed This Visit   None    Visit Diagnoses   Vaginitis and vulvovaginitis    -  Primary    Relevant Orders       WET PREP FOR TRICH, YEAST, CLUE       GC/chlamydia probe amp, genital       Note: This dictation was prepared with Dragon dictation along with smaller phrase technology. Any transcriptional errors that result from this process are unintentional.

## 2013-10-29 NOTE — Patient Instructions (Signed)
Take antibiotics as prescribed Use yeast pill after antibiotics completed F/U as previous or as needed

## 2013-10-30 LAB — GC/CHLAMYDIA PROBE AMP
CT PROBE, AMP APTIMA: NEGATIVE
GC PROBE AMP APTIMA: NEGATIVE

## 2013-11-01 NOTE — Progress Notes (Signed)
Call placed to patient and patient made aware.  

## 2015-02-03 ENCOUNTER — Emergency Department (HOSPITAL_COMMUNITY)
Admission: EM | Admit: 2015-02-03 | Discharge: 2015-02-03 | Disposition: A | Discharge status: Home / Self Care | Payer: Self-pay | Attending: Emergency Medicine | Admitting: Emergency Medicine

## 2015-02-03 ENCOUNTER — Encounter (HOSPITAL_COMMUNITY): Payer: Self-pay | Admitting: Emergency Medicine

## 2015-02-03 DIAGNOSIS — L02412 Cutaneous abscess of left axilla: Secondary | ICD-10-CM | POA: Insufficient documentation

## 2015-02-03 DIAGNOSIS — L0291 Cutaneous abscess, unspecified: Secondary | ICD-10-CM

## 2015-02-03 DIAGNOSIS — L02411 Cutaneous abscess of right axilla: Secondary | ICD-10-CM | POA: Insufficient documentation

## 2015-02-03 DIAGNOSIS — L02419 Cutaneous abscess of limb, unspecified: Secondary | ICD-10-CM

## 2015-02-03 DIAGNOSIS — Z792 Long term (current) use of antibiotics: Secondary | ICD-10-CM | POA: Insufficient documentation

## 2015-02-03 DIAGNOSIS — Z720 Tobacco use: Secondary | ICD-10-CM | POA: Insufficient documentation

## 2015-02-03 MED ORDER — NAPROXEN 500 MG PO TABS: 500.0000 mg | ORAL_TABLET | Freq: Two times a day (BID) | ORAL | Status: DC

## 2015-02-03 MED ORDER — CLINDAMYCIN HCL 150 MG PO CAPS: 300.0000 mg | ORAL_CAPSULE | Freq: Three times a day (TID) | ORAL | Status: DC

## 2015-02-03 MED ORDER — LIDOCAINE-EPINEPHRINE (PF) 2 %-1:200000 IJ SOLN: 20.0000 mL | Freq: Once | INTRAMUSCULAR | Status: DC

## 2015-02-03 NOTE — ED Provider Notes (Signed)
CSN: [id number]     Arrival date & time 02/03/15  0621 History   First MD Initiated Contact with Patient 02/03/15 [phone]     Chief Complaint  Patient presents with  . Abscess     (Consider location/radiation/quality/duration/timing/severity/associated sxs/prior Treatment) HPI  Pt is a 29yo female presenting to ED with c/o bilateral axillary boils.  Pt states that multiple areas of painful lumps have been coming and going for the last 6 months. States pain is aching and sore, 4/10 at worst, worse with palpation.  States some areas feel like they have pus in them, denies bleeding or discharge from any of the masses. Pt reports not coming in sooner due to fear of having them cut open. States she has used "fat back" on the boils which has made them decrease in size.  She states [place] about [place] and does not want to be uncomfortable and does not want them to get worse. Denies fever, chills, n/v/d.   History reviewed. No pertinent past medical history. History reviewed. No pertinent past surgical history. Family History  Problem Relation Age of Onset  . Hypertension Mother   . Hypertension Father   . Kidney disease Maternal Grandmother    History  Substance Use Topics  . Smoking status: Current Every Day Smoker    Types: Cigarettes  . Smokeless tobacco: Not on file  . Alcohol Use: Yes     Comment: socially   OB History    No data available     Review of Systems  Constitutional: Negative for fever, chills and fatigue.  Respiratory: Negative for shortness of breath.   Cardiovascular: Negative for chest pain and palpitations.  Musculoskeletal: Negative for myalgias and arthralgias.  Skin: Positive for color change, rash and wound. Negative for pallor.       Bilateral axial "boils"   All other systems reviewed and are negative.     Allergies  Review of patient's allergies indicates no known allergies.  Home Medications   Prior to Admission  medications   Medication Sig Start Date End Date Taking? Authorizing Provider  clindamycin (CLEOCIN) 150 MG capsule Take 2 capsules (300 mg total) by mouth 3 (three) times daily. For 7 days 02/03/15   Junius FinnerErin O'Malley, PA-C  fluconazole (DIFLUCAN) 150 MG tablet Take 1 tablet (150 mg total) by mouth once. 10/29/13   Salley ScarletKawanta F [place], MD  metroNIDAZOLE (FLAGYL) 500 MG tablet Take 1 tablet (500 mg total) by mouth 2 (two) times daily. 10/29/13   Salley ScarletKawanta F [place], MD  naproxen (NAPROSYN) 500 MG tablet Take 1 tablet (500 mg total) by mouth 2 (two) times daily. 02/03/15   Junius FinnerErin O'Malley, PA-C   BP 130/78 mmHg  Pulse 75  Temp(Src) 98.6 F (37 C) (Oral)  Resp 15  Ht 5\' 4"  (1.626 m)  Wt 200 lb (90.719 kg)  BMI 34.31 kg/m2  SpO2 100% Physical Exam  Constitutional: She is oriented to person, place, and time. She appears well-developed and well-nourished.  HENT:  Head: Normocephalic and atraumatic.  Eyes: EOM are normal.  Neck: Normal range of motion.  Cardiovascular: Normal rate.   Pulses:      Radial pulses are 2+ on the right side, and 2+ on the left side.  Pulmonary/Chest: Effort normal.  Musculoskeletal: Normal range of motion.  Bilateral arms: FROM, 5/5 strength bilaterally   Neurological: She is alert and oriented to person, place, and time.  Skin: Skin is warm and dry. There  is erythema.  Right axilla: 0.5cm mildly erythematous tender indurated nodule. Three 0.25cm tender nodules, no erythema or warmth. No fluctuance. No active discharge or bleeding.  Left axilla: 0.25cm hard tender nodule w/o fluctuance.  No erythema or warmth. 0.5 tender nodule w/o fluctuance, erythema or warmth. No active bleeding or discharge. No red streaking.   Psychiatric: She has a normal mood and affect. Her behavior is normal.  Nursing note and vitals reviewed.   ED Course  Procedures (including critical care time) Labs Review Labs Reviewed - No data to display  Imaging Review No results found.   EKG  Interpretation None      MDM   Final diagnoses:  Abscess of multiple sites  Axillary abscess   Pt is a 29yo female presenting to ED with multiple small abscesses in both axilla. No large abscesses requiring I&D at this time.  Will place pt on 7 day course of clindamycin. Home care instructions provided, including warm compresses several times a day. Advised to f/u with PCP for recheck of abscesses. Return precautions provided. Pt verbalized understanding and agreement with tx plan.    Junius Finner, PA-C 02/03/15 1610  Loren Racer, MD 02/10/15 [phone]

## 2015-02-03 NOTE — ED Notes (Signed)
Patient with multiple abscesses in both underarms, patient states that they are painful, uncomfortable. No drainage from areas, but some are hard, some feel like they have pus in them.

## 2015-02-03 NOTE — ED Notes (Signed)
I & D tray bedside 

## 2015-02-03 NOTE — Discharge Instructions (Signed)
Abscess °An abscess (boil or furuncle) is an infected area on or under the skin. This area is filled with yellowish-white fluid (pus) and other material (debris). °HOME CARE  °· Only take medicines as told by your doctor. °· If you were given antibiotic medicine, take it as directed. Finish the medicine even if you start to feel better. °· If gauze is used, follow your doctor's directions for changing the gauze. °· To avoid spreading the infection: °¨ Keep your abscess covered with a bandage. °¨ Wash your hands well. °¨ Do not share personal care items, towels, or whirlpools with others. °¨ Avoid skin contact with others. °· Keep your skin and clothes clean around the abscess. °· Keep all doctor visits as told. °GET HELP RIGHT AWAY IF:  °· You have more pain, puffiness (swelling), or redness in the wound site. °· You have more fluid or blood coming from the wound site. °· You have muscle aches, chills, or you feel sick. °· You have a fever. °MAKE SURE YOU:  °· Understand these instructions. °· Will watch your condition. °· Will get help right away if you are not doing well or get worse. °Document Released: 02/05/2008 Document Revised: 02/18/2012 Document Reviewed: 11/01/2011 °ExitCare® Patient Information ©2015 [employer]. This information is not intended to replace advice given to you by your health care provider. Make sure you discuss any questions you have with your health care provider. ° °

## 2015-04-26 ENCOUNTER — Emergency Department (HOSPITAL_COMMUNITY)
Admission: EM | Admit: 2015-04-26 | Discharge: 2015-04-26 | Disposition: A | Discharge status: Home / Self Care | Payer: Self-pay | Attending: Emergency Medicine | Admitting: Emergency Medicine

## 2015-04-26 ENCOUNTER — Encounter (HOSPITAL_COMMUNITY): Payer: Self-pay | Admitting: Physical Medicine and Rehabilitation

## 2015-04-26 DIAGNOSIS — Z79899 Other long term (current) drug therapy: Secondary | ICD-10-CM | POA: Insufficient documentation

## 2015-04-26 DIAGNOSIS — Z791 Long term (current) use of non-steroidal anti-inflammatories (NSAID): Secondary | ICD-10-CM | POA: Insufficient documentation

## 2015-04-26 DIAGNOSIS — H1132 Conjunctival hemorrhage, left eye: Secondary | ICD-10-CM | POA: Insufficient documentation

## 2015-04-26 DIAGNOSIS — H109 Unspecified conjunctivitis: Secondary | ICD-10-CM | POA: Insufficient documentation

## 2015-04-26 DIAGNOSIS — Z720 Tobacco use: Secondary | ICD-10-CM | POA: Insufficient documentation

## 2015-04-26 MED ORDER — ERYTHROMYCIN 5 MG/GM OP OINT: TOPICAL_OINTMENT | OPHTHALMIC | Status: DC

## 2015-04-26 MED ORDER — FLUORESCEIN SODIUM 1 MG OP STRP
1.0000 | ORAL_STRIP | Freq: Once | OPHTHALMIC | Status: AC
  Administered 2015-04-26: 1 via OPHTHALMIC
  Filled 2015-04-26: qty 1

## 2015-04-26 MED ORDER — TETRACAINE HCL 0.5 % OP SOLN
1.0000 [drp] | Freq: Once | OPHTHALMIC | Status: AC
  Administered 2015-04-26: 1 [drp] via OPHTHALMIC
  Filled 2015-04-26: qty 2

## 2015-04-26 NOTE — ED Notes (Signed)
Pt presents to department for evaluation of L eye pain/irritation. States insect flew into eye yesterday at [place], reports eye watering and redness. Pt is alert and oriented x4.

## 2015-04-26 NOTE — ED Provider Notes (Signed)
CSN: [id number]     Arrival date & time 04/26/15  0935 History  This chart was scribed for non-physician practitioner, Dorthula Matas, working with Gwyneth Sprout, MD by Freida Busman, ED Scribe. This patient was seen in room TR05C/TR05C and the patient's care was started at 10:55 AM.    Chief Complaint  Patient presents with  . Eye Pain    The history is provided by the patient. No language interpreter was used.     HPI Comments:  Christy Holden is a 29 y.o. female who presents to the Emergency Department complaining of throbbing  left eye pain since yesterday after an insect flew into her the eye while at [place]. She notes her symptom worsened this morning after accidentally poking herself in the eye. It was mildly red when she initially woke up but after poking herself in the eye it turned darker red. She reports associated  mild photophobia, redness and increased watering from the eye.  No fevers Denies decreased vision or blurry vision. No alleviating factors noted, she has not tried any treatments. Denies headache or confusion.  History reviewed. No pertinent past medical history. History reviewed. No pertinent past surgical history. Family History  Problem Relation Age of Onset  . Hypertension Mother   . Hypertension Father   . Kidney disease Maternal Grandmother    Social History  Substance Use Topics  . Smoking status: Current Every Day Smoker    Types: Cigarettes  . Smokeless tobacco: None  . Alcohol Use: Yes   OB History    No data available     Review of Systems  Eyes: Positive for photophobia, pain and redness.  All other systems reviewed and are negative.   Allergies  Review of patient's allergies indicates no known allergies.  Home Medications   Prior to Admission medications   Medication Sig Start Date End Date Taking? Authorizing Provider  clindamycin (CLEOCIN) 150 MG capsule Take 2 capsules (300 mg total) by mouth 3 (three) times daily. For 7  days 02/03/15   Junius Finner, PA-C  erythromycin ophthalmic ointment Place a 1/2 inch ribbon of ointment into the lower eyelid BID for 7 days 04/26/15   Marlon Pel, PA-C  fluconazole (DIFLUCAN) 150 MG tablet Take 1 tablet (150 mg total) by mouth once. 10/29/13   Salley Scarlet, MD  metroNIDAZOLE (FLAGYL) 500 MG tablet Take 1 tablet (500 mg total) by mouth 2 (two) times daily. 10/29/13   Salley Scarlet, MD  naproxen (NAPROSYN) 500 MG tablet Take 1 tablet (500 mg total) by mouth 2 (two) times daily. 02/03/15   Junius Finner, PA-C   BP 137/82 mmHg  Pulse 80  Temp(Src) 98.8 F (37.1 C) (Oral)  Resp 18  SpO2 100% Physical Exam  Constitutional: She is oriented to person, place, and time. She appears well-developed and well-nourished. No distress.  HENT:  Head: Normocephalic and atraumatic.  Eyes: EOM and lids are normal. Lids are everted and swept, no foreign bodies found. Right conjunctiva has a hemorrhage. Left conjunctiva is injected.    No fluorescein uptake.  Neck: Normal range of motion.  Cardiovascular: Normal rate.   Pulmonary/Chest: Effort normal.  Musculoskeletal: Normal range of motion.  Neurological: She is alert and oriented to person, place, and time.  Skin: Skin is warm and dry.  Psychiatric: She has a normal mood and affect. Her behavior is normal.  Nursing note and vitals reviewed.   ED Course  Procedures   DIAGNOSTIC STUDIES:  Oxygen Saturation  is 100% on RA, normal by my interpretation.    COORDINATION OF CARE:  11:01 AM Will treat with antibiotics and discharge with oral anti-inflammatory meds and referral to ophthalmologist. Instructed pt to call ophthalmologist today to be seen either today or tomorrow and if she has difficulty being able to see the Opthalmologist today she is to call and let me know. Discussed treatment plan with pt at bedside and pt agreed to plan.  Labs Review Labs Reviewed - No data to display  Imaging Review No results found. I  have personally reviewed and evaluated these images and lab results as part of my medical decision-making.   EKG Interpretation None      MDM   Final diagnoses:  Subconjunctival hemorrhage, left  Conjunctivitis of left eye    Medications  fluorescein ophthalmic strip 1 strip (1 strip Both Eyes Given 04/26/15 1000)  tetracaine (PONTOCAINE) 0.5 % ophthalmic solution 1 drop (1 drop Both Eyes Given 04/26/15 1000)    28 y.o.Christy Holden's evaluation in the Emergency Department is complete. It has been determined that no acute conditions requiring further emergency intervention are present at this time. The patient/guardian have been advised of the diagnosis and plan. We have discussed signs and symptoms that warrant return to the ED, such as changes or worsening in symptoms.  Vital signs are stable at discharge. Filed Vitals:   04/26/15 0946  BP: 137/82  Pulse: 80  Temp: 98.8 F (37.1 C)  Resp: 18    Patient/guardian has voiced understanding and agreed to follow-up with the PCP or specialist.  I personally performed the services described in this documentation, which was scribed in my presence. The recorded information has been reviewed and is accurate.   Marlon Pel, PA-C 04/26/15 1113  Gwyneth Sprout, MD 04/26/15 2228

## 2015-04-26 NOTE — Discharge Instructions (Signed)
Bacterial Conjunctivitis °Bacterial conjunctivitis, commonly called pink eye, is an inflammation of the clear membrane that covers the white part of the eye (conjunctiva). The inflammation can also happen on the underside of the eyelids. The blood vessels in the conjunctiva become inflamed, causing the eye to become red or pink. Bacterial conjunctivitis may spread easily from one eye to another and from person to person (contagious).  °CAUSES  °Bacterial conjunctivitis is caused by bacteria. The bacteria may come from your own skin, your upper respiratory tract, or from someone else with bacterial conjunctivitis. °SYMPTOMS  °The normally white color of the eye or the underside of the eyelid is usually pink or red. The pink eye is usually associated with irritation, tearing, and some sensitivity to light. Bacterial conjunctivitis is often associated with a thick, yellowish discharge from the eye. The discharge may turn into a crust on the eyelids overnight, which causes your eyelids to stick together. If a discharge is present, there may also be some blurred vision in the affected eye. °DIAGNOSIS  °Bacterial conjunctivitis is diagnosed by your caregiver through an eye exam and the symptoms that you report. Your caregiver looks for changes in the surface tissues of your eyes, which may point to the specific type of conjunctivitis. A sample of any discharge may be collected on a cotton-tip swab if you have a severe case of conjunctivitis, if your cornea is affected, or if you keep getting repeat infections that do not respond to treatment. The sample will be sent to a lab to see if the inflammation is caused by a bacterial infection and to see if the infection will respond to antibiotic medicines. °TREATMENT  °· Bacterial conjunctivitis is treated with antibiotics. Antibiotic eyedrops are most often used. However, antibiotic ointments are also available. Antibiotics pills are sometimes used. Artificial tears or eye  washes may ease discomfort. °HOME CARE INSTRUCTIONS  °· To ease discomfort, apply a cool, clean washcloth to your eye for 10-20 minutes, 3-4 times a day. °· Gently wipe away any drainage from your eye with a warm, wet washcloth or a cotton ball. °· Wash your hands often with soap and water. Use paper towels to dry your hands. °· Do not share towels or washcloths. This may spread the infection. °· Change or wash your pillowcase every day. °· You should not use eye makeup until the infection is gone. °· Do not operate machinery or drive if your vision is blurred. °· Stop using contact lenses. Ask your caregiver how to sterilize or replace your contacts before using them again. This depends on the type of contact lenses that you use. °· When applying medicine to the infected eye, do not touch the edge of your eyelid with the eyedrop bottle or ointment tube. °SEEK IMMEDIATE MEDICAL CARE IF:  °· Your infection has not improved within 3 days after beginning treatment. °· You had yellow discharge from your eye and it returns. °· You have increased eye pain. °· Your eye redness is spreading. °· Your vision becomes blurred. °· You have a fever or persistent symptoms for more than 2-3 days. °· You have a fever and your symptoms suddenly get worse. °· You have facial pain, redness, or swelling. °MAKE SURE YOU:  °· Understand these instructions. °· Will watch your condition. °· Will get help right away if you are not doing well or get worse. °Document Released: 08/19/2005 Document Revised: 01/03/2014 Document Reviewed: 01/20/2012 °ExitCare® Patient Information ©2015 [employer]. This information is not intended to   replace advice given to you by your health care provider. Make sure you discuss any questions you have with your health care provider.  Subconjunctival Hemorrhage A subconjunctival hemorrhage is a bright red patch covering a portion of the white of the eye. The white part of the eye is called the sclera, and it  is covered by a thin membrane called the conjunctiva. This membrane is clear, except for tiny blood vessels that you can see with the naked eye. When your eye is irritated or inflamed and becomes red, it is because the vessels in the conjunctiva are swollen. Sometimes, a blood vessel in the conjunctiva can break and bleed. When this occurs, the blood builds up between the conjunctiva and the sclera, and spreads out to create a red area. The red spot may be very small at first. It may then spread to cover a larger part of the surface of the eye, or even all of the visible white part of the eye. In almost all cases, the blood will go away and the eye will become white again. Before completely dissolving, however, the red area may spread. It may also become brownish-yellow in color before going away. If a lot of blood collects under the conjunctiva, it may look like a bulge on the surface of the eye. This looks scary, but it will also eventually flatten out and go away. Subconjunctival hemorrhages do not cause pain, but if swollen, may cause a feeling of irritation. There is no effect on vision.  CAUSES   The most common cause is mild trauma (rubbing the eye, irritation).  Subconjunctival hemorrhages can happen because of coughing or straining (lifting heavy objects), vomiting, or sneezing.  In some cases, your doctor may want to check your blood pressure. High blood pressure can also cause a subconjunctival hemorrhage.  Severe trauma or blunt injuries.  Diseases that affect blood clotting (hemophilia, leukemia).  Abnormalities of blood vessels behind the eye (carotid cavernous sinus fistula).  Tumors behind the eye.  Certain drugs (aspirin, Coumadin, heparin).  Recent eye surgery. HOME CARE INSTRUCTIONS   Do not worry about the appearance of your eye. You may continue your usual activities.  Often, follow-up is not necessary. SEEK MEDICAL CARE IF:   Your eye becomes painful.  The  bleeding does not disappear within 3 weeks.  Bleeding occurs elsewhere, for example, under the skin, in the mouth, or in the other eye.  You have recurring subconjunctival hemorrhages. SEEK IMMEDIATE MEDICAL CARE IF:   Your vision changes or you have difficulty seeing.  You develop a severe headache, persistent vomiting, confusion, or abnormal drowsiness (lethargy).  Your eye seems to bulge or protrude from the eye socket.  You notice the sudden appearance of bruises or have spontaneous bleeding elsewhere on your body. Document Released: 08/19/2005 Document Revised: 01/03/2014 Document Reviewed: 07/17/2009 [facility] Patient Information 2015 [place], [place]. This information is not intended to replace advice given to you by your health care provider. Make sure you discuss any questions you have with your health care provider.

## 2015-05-26 ENCOUNTER — Ambulatory Visit (INDEPENDENT_AMBULATORY_CARE_PROVIDER_SITE_OTHER): Payer: BLUE CROSS/BLUE SHIELD | Admitting: Emergency Medicine

## 2015-05-26 VITALS — BP 132/80 | HR 66 | Temp 98.7°F | Resp 17 | Ht 64.5 in | Wt 193.0 lb

## 2015-05-26 DIAGNOSIS — H578 Other specified disorders of eye and adnexa: Secondary | ICD-10-CM | POA: Diagnosis not present

## 2015-05-26 DIAGNOSIS — H5789 Other specified disorders of eye and adnexa: Secondary | ICD-10-CM

## 2015-05-26 NOTE — Progress Notes (Signed)
This chart was scribed for Lesle Chris, MD by Stann Ore, Medical Scribe. This patient was seen in Room 1 and the patient's care was started 1:35 PM.  Chief Complaint:  Chief Complaint  Patient presents with  . FMLA paper work    HPI: Christy Holden is a 29 y.o. female who reports to [employer] today for FMLA paper work. Pt went to ED for Subconjunctival hemorrhage 3 weeks ago on Aug 24th. The patient was given 3 days off. She's been back to work for over 2 weeks.   She works at [employer].   No past medical history on file. No past surgical history on file. Social History   Social History  . Marital Status: Single    Spouse Name: N/A  . Number of Children: N/A  . Years of Education: N/A   Social History Main Topics  . Smoking status: Current Every Day Smoker -- 0.50 packs/day for 7 years    Types: Cigarettes  . Smokeless tobacco: None  . Alcohol Use: 0.0 oz/week    0 Standard drinks or equivalent per week  . Drug Use: Yes    Special: Marijuana  . Sexual Activity: Yes    Birth Control/ Protection: Implant   Other Topics Concern  . None   Social History Narrative   Family History  Problem Relation Age of Onset  . Hypertension Mother   . Hypertension Father   . Kidney disease Maternal Grandmother    No Known Allergies Prior to Admission medications   Not on File     ROS:  Constitutional: negative for chills, fever, night sweats, weight changes, or fatigue  HEENT: negative for vision changes, hearing loss, congestion, rhinorrhea, ST, epistaxis, or sinus pressure Cardiovascular: negative for chest pain or palpitations Respiratory: negative for hemoptysis, wheezing, shortness of breath, or cough Abdominal: negative for abdominal pain, nausea, vomiting, diarrhea, or constipation Dermatological: negative for rash Neurologic: negative for headache, dizziness, or syncope All other systems reviewed and are otherwise negative with the exception to those above and in  the HPI.  PHYSICAL EXAM: Filed Vitals:   05/26/15 1317  BP: 132/80  Pulse: 66  Temp: 98.7 F (37.1 C)  Resp: 17   Body mass index is 32.63 kg/(m^2).   General: Alert, no acute distress HEENT:  Normocephalic, atraumatic, oropharynx patent. Eye: [facility] Cardiovascular:  Regular rate and rhythm, no rubs murmurs or gallops.  No Carotid bruits, radial pulse intact. No pedal edema.  Respiratory: Clear to auscultation bilaterally.  No wheezes, rales, or rhonchi.  No cyanosis, no use of accessory musculature Abdominal: No organomegaly, abdomen is soft and non-tender, positive bowel sounds. No masses. Musculoskeletal: Gait intact. No edema, tenderness Skin: No rashes. Neurologic: Facial musculature symmetric. Psychiatric: Patient acts appropriately throughout our interaction.  Lymphatic: No cervical or submandibular lymphadenopathy Genitourinary/Anorectal: No acute findings   LABS:    EKG/XRAY:   Primary read interpreted by Dr. Cleta Alberts at [facility].   ASSESSMENT/PLAN: Her eye exam is normal. She was given a note to excuse her from work for 3 days.I personally performed the services described in this documentation, which was scribed in my presence. The recorded information has been reviewed and is accurate.  By signing my name below, I, Stann Ore, attest that this documentation has been prepared under the direction and in the presence of Lesle Chris, MD. Electronically Signed: Stann Ore, Scribe. 05/26/2015 , 1:35 PM .   Gross sideeffects, risk and benefits, and alternatives of medications d/w patient. Patient  is aware that all medications have potential sideeffects and we are unable to predict every sideeffect or drug-drug interaction that may occur.  Lesle Chris MD 05/26/2015 1:35 PM

## 2015-08-13 ENCOUNTER — Encounter (HOSPITAL_COMMUNITY): Payer: Self-pay | Admitting: Emergency Medicine

## 2015-08-13 ENCOUNTER — Emergency Department (HOSPITAL_COMMUNITY)
Admission: EM | Admit: 2015-08-13 | Discharge: 2015-08-13 | Disposition: A | Discharge status: Home / Self Care | Payer: BLUE CROSS/BLUE SHIELD | Attending: Emergency Medicine | Admitting: Emergency Medicine

## 2015-08-13 DIAGNOSIS — M79622 Pain in left upper arm: Secondary | ICD-10-CM | POA: Diagnosis not present

## 2015-08-13 DIAGNOSIS — F17210 Nicotine dependence, cigarettes, uncomplicated: Secondary | ICD-10-CM | POA: Diagnosis not present

## 2015-08-13 DIAGNOSIS — L02412 Cutaneous abscess of left axilla: Secondary | ICD-10-CM | POA: Insufficient documentation

## 2015-08-13 DIAGNOSIS — L732 Hidradenitis suppurativa: Secondary | ICD-10-CM | POA: Insufficient documentation

## 2015-08-13 DIAGNOSIS — L02411 Cutaneous abscess of right axilla: Secondary | ICD-10-CM | POA: Insufficient documentation

## 2015-08-13 DIAGNOSIS — M79621 Pain in right upper arm: Secondary | ICD-10-CM | POA: Diagnosis present

## 2015-08-13 MED ORDER — LIDOCAINE-EPINEPHRINE (PF) 2 %-1:200000 IJ SOLN
40.0000 mL | Freq: Once | INTRAMUSCULAR | Status: AC
  Administered 2015-08-13: 40 mL via INTRADERMAL
  Filled 2015-08-13: qty 40

## 2015-08-13 MED ORDER — SULFAMETHOXAZOLE-TRIMETHOPRIM 800-160 MG PO TABS: 1.0000 | ORAL_TABLET | Freq: Two times a day (BID) | ORAL | Status: AC

## 2015-08-13 NOTE — ED Notes (Addendum)
Pt c/o pain to B/L axillary area ongoing for 1 year. Pt reports that she is unable to shave or use deodorant under her arm due to the pain. Pt reports that there is an area with a lump that looks like it has been draining.

## 2015-08-13 NOTE — Discharge Instructions (Signed)
Do not hesitate to return to the emergency room for any new, worsening or concerning symptoms.  Please obtain primary care using resource guide below. Let them know that you were seen in the emergency room and that they will need to obtain records for further outpatient management.   Hidradenitis Suppurativa Hidradenitis suppurativa is a long-term (chronic) skin disease that starts with blocked sweat glands or hair follicles. Bacteria may grow in these blocked openings of your skin. Hidradenitis suppurativa is like a severe form of acne that develops in areas of your body where acne would be unusual. It is most likely to affect the areas of your body where skin rubs against skin and becomes moist. This includes your:  Underarms.  Groin.  Genital areas.  Buttocks.  Upper thighs.  Breasts. Hidradenitis suppurativa may start out with small pimples. The pimples can develop into deep sores that break open (rupture) and drain pus. Over time your skin may thicken and become scarred. Hidradenitis suppurativa cannot be passed from person to person.  CAUSES  The exact cause of hidradenitis suppurativa is not known. This condition may be due to:  Female and female hormones. The condition is rare before and after puberty.  An overactive body defense system (immune system). Your immune system may overreact to the blocked hair follicles or sweat glands and cause swelling and pus-filled sores. RISK FACTORS You may have a higher risk of hidradenitis suppurativa if you:  Are a woman.  Are between ages 90 and 60.  Have a family history of hidradenitis suppurativa.  Have a personal history of acne.  Are overweight.  Smoke.  Take the drug lithium. SIGNS AND SYMPTOMS  The first signs of an outbreak are usually painful skin bumps that look like pimples. As the condition progresses:  Skin bumps may get bigger and grow deeper into the skin.  Bumps under the skin may rupture and drain smelly  pus.  Skin may become itchy and infected.  Skin may thicken and scar.  Drainage may continue through tunnels under the skin (fistulas).  Walking and moving your arms can become painful. DIAGNOSIS  Your health care provider may diagnose hidradenitis suppurativa based on your medical history and your signs and symptoms. A physical exam will also be done. You may need to see a health care provider who specializes in skin diseases (dermatologist). You may also have tests done to confirm the diagnosis. These can include:  Swabbing a sample of pus or drainage from your skin so it can be sent to the lab and tested for infection.  Blood tests to check for infection. TREATMENT  The same treatment will not work for everybody with hidradenitis suppurativa. Your treatment will depend on how severe your symptoms are. You may need to try several treatments to find what works best for you. Part of your treatment may include cleaning and bandaging (dressing) your wounds. You may also have to take medicines, such as the following:  Antibiotics.  Acne medicines.  Medicines to block or suppress the immune system.  A diabetes medicine (metformin) is sometimes used to treat this condition.  For women, birth control pills can sometimes help relieve symptoms. You may need surgery if you have a severe case of hidradenitis suppurativa that does not respond to medicine. Surgery may involve:   Using a laser to clear the skin and remove hair follicles.  Opening and draining deep sores.  Removing the areas of skin that are diseased and scarred. HOME CARE INSTRUCTIONS  Learn as much as you can about your disease, and work closely with your health care providers.  Take medicines only as directed by your health care provider.  If you were prescribed an antibiotic medicine, finish it all even if you start to feel better.  If you are overweight, losing weight may be very helpful. Try to reach and maintain a  healthy weight.  Do not use any tobacco products, including cigarettes, chewing tobacco, or electronic cigarettes. If you need help quitting, ask your health care provider.  Do not shave the areas where you get hidradenitis suppurativa.  Do not wear deodorant.  Wear loose-fitting clothes.  Try not to overheat and get sweaty.  Take a daily bleach bath as directed by your health care provider.  Fill your bathtub halfway with water.  Pour in  cup of unscented household bleach.  Soak for 5-10 minutes.  Cover sore areas with a warm, clean washcloth (compress) for 5-10 minutes. SEEK MEDICAL CARE IF:   You have a flare-up of hidradenitis suppurativa.  You have chills or a fever.  You are having trouble controlling your symptoms at home.   This information is not intended to replace advice given to you by your health care provider. Make sure you discuss any questions you have with your health care provider.   Document Released: 04/02/2004 Document Revised: 09/09/2014 Document Reviewed: 11/19/2013 Elsevier Interactive Patient Education 2016 [employer].    Emergency Department Resource Guide 1) Find a Doctor and Pay Out of Pocket Although you won't have to find out who is covered by your insurance plan, it is a good idea to ask around and get recommendations. You will then need to call the office and see if the doctor you have chosen will accept you as a new patient and what types of options they offer for patients who are self-pay. Some doctors offer discounts or will set up payment plans for their patients who do not have insurance, but you will need to ask so you aren't surprised when you get to your appointment.  2) Contact Your Local Health Department Not all health departments have doctors that can see patients for sick visits, but many do, so it is worth a call to see if yours does. If you don't know where your local health department is, you can check in your phone book.  The CDC also has a tool to help you locate your state's health department, and many state websites also have listings of all of their local health departments.  3) Find a Walk-in Clinic If your illness is not likely to be very severe or complicated, you may want to try a walk in clinic. These are popping up all over the country in pharmacies, drugstores, and shopping centers. They're usually staffed by nurse practitioners or physician assistants that have been trained to treat common illnesses and complaints. They're usually fairly quick and inexpensive. However, if you have serious medical issues or chronic medical problems, these are probably not your best option.  No Primary Care Doctor: - Call Health Connect at  [phone] - they can help you locate a primary care doctor that  accepts your insurance, provides certain services, etc. - Physician Referral Service- [phone]  Chronic Pain Problems: Organization         Address  Phone   Notes  [facility]  [phone] Patients need to be referred by their primary care doctor.   Medication Assistance: Organization  Address  Phone   Notes  [facility] Medication [facility] [address] [place] [place], [place] [address] [phone] --Must be a resident of [facility] [place] -- Must have NO insurance coverage whatsoever (no Medicaid/ Medicare, etc.) -- The pt. MUST have a primary care doctor that directs their care regularly and follows them in the community   Med[facility]ist  [phone]   [employer]  [phone]    Agencies that provide inexpensive medical care: Organization         Address  Phone   Notes  [facility]  [phone]   [facility]    [phone]   [facility] [address] [place], [place] [address] [phone]   [facility] [address] [place] [place] [phone]   Planned Parenthood     [phone]   [facility]    [phone]   [facility]  [address], [place] Phone:  [phone], Fax:  [phone] Hours of Operation:  9 am - 6 pm, M-F.  Also accepts Medicaid/Medicare and self-pay.  [facility]  [address], [place] Phone: [phone], Fax: [phone]. Hours of Operation:  8:30 am - 5:30 pm, M-F.  Also accepts Medicaid and self-pay.  [facility] [place] [address] [place] Phone: [phone]   [facility] [address] Natasha Bence [place], [place] [phone], Ext. [phone] Mondays & Thursdays: 7-9 AM.  First 15 patients are seen on a first come, first serve basis.    Medicaid-accepting [facility] Providers:  Organization         Address  Phone   Notes  [facility] [address], [place] [phone] Also accepts self-pay patients.  [facility] [address] Laurell Josephs [place], [place]  [phone]   [facility] [address], [place] [phone]   [facility] [address], [place] [phone]   Renaye Rakers [address], [place]   [phone] Only accepts [place] patients after they have their name applied to their card.   Self-Pay (no insurance) in [facility]:  Organization         Address  Phone   Notes  Sickle Cell Patients, [facility] [address] [place], [place] [phone]   [facility] [address] [place], [place] [phone]   [facility] [place]  [address], [place] [phone]   [facility]/Dr. Osei-Bonsu  [address] [place] or [address], [place] [phone] Phone number for both [place] and [place] locations is the same.  Urgent Medical and  [facility] [address] [place] [phone]   [facility] [address], [place] or [address] [phone]   [facility] [address] [place] [phone], phone; [phone], fax Sees patients 1st and 3rd Saturday of every month.  Must not qualify for public or private insurance (i.e. Medicaid, Medicare, [place] Health Choice, Veterans' Benefits)  Household income should be no more than 200% of the poverty level The clinic cannot treat you if you are pregnant or think you  are pregnant  Sexually transmitted diseases are not treated at the clinic.    Dental Care: Organization         Address  Phone  Notes  [facility] [address], [place] [phone] Accepts children up to age 27 who are enrolled in [place] or [place] Health Choice; pregnant women with a Medicaid card; and children who have applied for Medicaid or [place] Health Choice, but were declined, whose parents can pay a reduced fee at time of service.  [facility] [place]  [address], [place] [phone] Accepts children up to age 27 who are enrolled in [place] or [place] Health Choice; pregnant women with a Medicaid card; and children who have applied for Medicaid or  Health Choice, but were declined, whose parents can pay a reduced fee at time of service.  [facility]  [address], [place] [phone] Patients are seen by appointment only. Walk-ins are not accepted. [facility] will see patients 27 years of age and older. Monday - Tuesday (8am-5pm) Most Wednesdays (8:30-5pm) $30 per visit, cash only  [facility]  [address], [place] [facility] [place] [phone] Patients are seen by appointment only. Walk-ins are not accepted. [facility] will see patients 27 years of age and  older. One Wednesday Evening (Monthly: Volunteer Based).  $30 per visit, cash only  [employer] [facility]  [phone] for adults; Children under age 114, call Graduate Pediatric Dentistry at [phone]. Children aged 84-14, please call [phone] to request a pediatric application.  Dental services are provided in all areas of dental care including fillings, crowns and bridges, complete and partial dentures, implants, gum treatment, root canals, and extractions. Preventive care is also provided. Treatment is provided to both adults and children. Patients are selected via a lottery and there is often a waiting list.   [facility] [address], [place]  [phone] [email or web address]   [facility] [address], [place], [place] [phone], Ext. [phone] Second and Fourth Thursday of each month, opens at 6:30 AM; Clinic ends at 9 AM.  Patients are seen on a first-come first-served basis, and a limited number are seen during each clinic.   [facility]  [address], [place], [place] [phone]   Eligibility Requirements You must have lived in [place], [place], or [place] counties for at least the last three months.   You cannot be eligible for state or federal sponsored [place] Cityhealthcare insurance, including CIGNAVeterans Administration, [place]icaid, or [employer]icare.   You generally cannot be eligible for healthcare insurance through your employer.    How to apply: Eligibility screenings are held every Tuesday and Wednesday afternoon from 1:00 pm until 4:00 pm. You do not need an appointment for the interview!  [facility] [address], [place], [place] [phone]   [facility]  [phone]   [facility]  [phone]   [facility]  [phone]    Behavioral Health Resources in the Community: Intensive Outpatient Programs Organization          Address  Phone  Notes  [facility] [address], [place], [place] [phone]   [facility] [address], [place], [place] [phone]   [facility] [address], [place], [place]   [place] [address]  [place], [place] or [phone]   Substance Abuse Resources Organization         Address  Phone  Notes  Alcohol and Drug Services  [phone]   [place]  [phone]   [facility] [place]   Chinita Pester  [phone]   Residential & Outpatient Substance Abuse Program  [phone]   Psychological Services Organization         Address  Phone  Notes  [facility] [place]  [place]  [phone]   [place] [address], [place] or [phone]    Mobile Crisis Teams Organization         Address  Phone  Notes  [facility], Mobile Crisis Care Unit  [phone]   [facility]  [address] [employer], [place]   [place] [address], [place] [phone]    Self-Help/Support Groups Organization         Address  Phone             Notes  [place]. of [place] - variety of support groups  [place] Call for more information  Narcotics Anonymous (NA), [facility] [address], [employer] [facility]  2 meetings at this location   Special educational needs teacher         Address  Phone  Notes  [facility] Residential Treatment [place],    [place]  [phone]   [facility]  [address] [place] [phone], [place], [place]   [place], [place] [phone] Admissions: 8am-3pm M-F  Incentives Substance [place] [address],    [place], [place] [phone]   [facility] [address] [place], [place], [place]   [facility] [address]  [place], [place]   [facility] - Intensive Outpatient [address] Kristeen Mans [address], [place], [place]   [facility] ([place].,  [place], [place] [phone] or [phone]   [facility] Services (RTS) [address], [place], [place] Accepts Medicaid  [facility] [address],  [place] [phone] Substance Abuse/Addiction Treatment   [facility] Organization         Address  Phone  Notes  [facility]  [phone]   Domenic Schwab, PhD [address] Arlis Porta [place], [place]   [phone] or [phone]   [facility] [place], [place] [phone]   [facility] [address], [place], [place] [phone] Insurance/Medicaid/sponsorship through [facility] and Families [address], [address] [place]                                    [place], [place] [phone] [place] [address] Drive[place] [place], [place] [phone]    Dr. Adele Schilder  [phone]   [facility]. 1) [address], [place] 2) [place] 3)  [place], [place] [phone]  [phone]   [place] [phone])  [phone] or [phone] (After Hours)    ° ° ° °

## 2015-08-13 NOTE — ED Notes (Signed)
Pt is in stable condition upon d/c and ambulates from ED. 

## 2015-08-13 NOTE — ED Provider Notes (Signed)
CSN: [id number]     Arrival date & time 08/13/15  0830 History  By signing my name below, I, Christy Holden, attest that this documentation has been prepared under the direction and in the presence of [employer], PA-C. Electronically Signed: Elon Holden ED Scribe. 08/13/2015. 9:26 AM.    Chief Complaint  Patient presents with  . Axillary pain    Axillary pain   The history is provided by the patient and medical records. No language interpreter was used.   HPI Comments: Christy Holden is a 29 y.o. female who presents to the Emergency Department complaining of abscesses on the bilateral axillas onset 1 year ago.  The patient reports the complaints wax and wane in size and intermittently drain but never exhibit a pustule.  She has been seen in the ED for this complaint previously where she was dx'd with abscess and prescribed clindamycin, however, I&D was not indicated at that time.  She was advised to f/u with PCP but has not had the opportunity to do so.  She denies nausea, vomiting.  NKA  History reviewed. No pertinent past medical history. History reviewed. No pertinent past surgical history. Family History  Problem Relation Age of Onset  . Hypertension Mother   . Hypertension Father   . Kidney disease Maternal Grandmother    Social History  Substance Use Topics  . Smoking status: Current Every Day Smoker -- 0.50 packs/day for 7 years    Types: Cigarettes  . Smokeless tobacco: None  . Alcohol Use: 0.0 oz/week    0 Standard drinks or equivalent per week   OB History    No data available     Review of Systems A complete 10 system review of systems was obtained and all systems are negative except as noted in the HPI and PMH.  Allergies  Review of patient's allergies indicates no known allergies.  Home Medications   Prior to Admission medications   Not on File   BP 125/73 mmHg  Pulse 69  Temp(Src) 98.1 F (36.7 C) (Oral)  Resp 18  Ht  (1.626 m)  Wt 196 lb  (88.905 kg)  BMI 33.63 kg/m2  SpO2 100% Physical Exam  Constitutional: She is oriented to person, place, and time. She appears well-developed and well-nourished. No distress.  HENT:  Head: Normocephalic and atraumatic.  Mouth/Throat: Oropharynx is clear and moist.  Eyes: Conjunctivae and EOM are normal. Pupils are equal, round, and reactive to light.  Neck: Normal range of motion. Neck supple. No tracheal deviation present.  Cardiovascular: Normal rate, regular rhythm and intact distal pulses.   Pulmonary/Chest: Effort normal and breath sounds normal. No respiratory distress.  Abdominal: Soft. There is no tenderness.  Musculoskeletal: Normal range of motion.  Neurological: She is alert and oriented to person, place, and time.  Skin: Skin is warm and dry. She is not diaphoretic.  Bilateral axilla with multiple abscesses/tracks, no surrounding cellulitis, no active drainage.  Psychiatric: She has a normal mood and affect. Her behavior is normal.  Nursing note and vitals reviewed.   ED Course  .Marland KitchenIncision and Drainage Date/Time: 08/13/2015 10:31 AM Performed by: Christy Holden Authorized by: Christy Holden Consent: Verbal consent obtained. Risks and benefits: risks, benefits and alternatives were discussed Consent given by: patient Patient identity confirmed: verbally with patient Type: abscess Location: Left Axilla. Anesthesia: local infiltration Local anesthetic: lidocaine 2% with epinephrine Anesthetic total: 5 ml Patient sedated: no Risk factor: underlying major vessel Scalpel size: 11 Incision type: elliptical (  3) Incision depth: dermal Complexity: complex Drainage: purulent and  bloody Drainage amount: moderate Wound treatment: wound left open Packing material: none Patient tolerance: Patient tolerated the procedure well with no immediate complications   .Marland Kitchen.Incision and Drainage Date/Time: 08/13/2015 10:31 AM Performed by: Christy Holden, Christy Holden Authorized by:  Christy Holden, Christy Holden Consent: Verbal consent obtained. Risks and benefits: risks, benefits and alternatives were discussed Consent given by: patient Patient identity confirmed: verbally with patient Type: abscess Location: Right Axilla. Anesthesia: local infiltration Local anesthetic: lidocaine 2% with epinephrine Anesthetic total: 5 ml Patient sedated: no Risk factor: underlying major vessel Scalpel size: 11 Incision type: elliptical (3) Incision depth: dermal Complexity: complex Drainage: purulent and  bloody Drainage amount: moderate Wound treatment: wound left open Packing material: none Patient tolerance: Patient tolerated the procedure well with no immediate complications  DIAGNOSTIC STUDIES: Oxygen Saturation is 100% on RA, normal by my interpretation.    COORDINATION OF CARE:  9:29 AM Discussed plans to perform I&D.  Patient should f/u with PCP.  Patient agreed to plan  Labs Review Labs Reviewed - No data to display  Imaging Review No results found. I have personally reviewed and evaluated these images and lab results as part of my medical decision-making.   EKG Interpretation None      MDM   Final diagnoses:  None   Filed Vitals:   08/13/15 0837  BP: 125/73  Pulse: 69  Temp: 98.1 F (36.7 C)  TempSrc: Oral  Resp: 18  Height: 5\' 4"  (1.626 m)  Weight: 88.905 kg  SpO2: 100%    Medications  lidocaine-EPINEPHrine (XYLOCAINE W/EPI) 2 %-1:200000 (PF) injection 40 mL (40 mLs Intradermal Given by Other 08/13/15 0940)    Christy Holden is 29 y.o. female presenting with exacerbation of pain for her hidradenitis Cipro T Ven the bilateral axilla. Patient has extensive sinus tracts. 3 incisions made in each axilla bilaterally. Patient will be started on Bactrim, and explained to her that she will need a surgical evaluation. There is no signs of systemic infection. Patient states that she recently got insurance and is looking to establish primary care. Resource  guide given.  Evaluation does not show pathology that would require ongoing emergent intervention or inpatient treatment. Pt is hemodynamically stable and mentating appropriately. Discussed findings and plan with patient/guardian, who agrees with care plan. All questions answered. Return precautions discussed and outpatient follow up given.   Discharge Medication List as of 08/13/2015  9:59 AM    START taking these medications   Details  sulfamethoxazole-trimethoprim (BACTRIM DS,SEPTRA DS) 800-160 MG tablet Take 1 tablet by mouth 2 (two) times daily., Starting 08/13/2015, Until Sun 08/20/15, Print         I personally performed the services described in this documentation, which was scribed in my presence. The recorded information has been reviewed and is accurate.    Christy Emeryicole Alanea Woolridge, PA-C 08/13/15 1046  Cathren LaineKevin Steinl, MD 08/13/15 [phone]

## 2015-11-08 ENCOUNTER — Encounter (HOSPITAL_COMMUNITY): Payer: Self-pay | Admitting: Emergency Medicine

## 2015-11-08 ENCOUNTER — Emergency Department (HOSPITAL_COMMUNITY)
Admission: EM | Admit: 2015-11-08 | Discharge: 2015-11-08 | Disposition: A | Discharge status: Home / Self Care | Payer: BLUE CROSS/BLUE SHIELD | Attending: Emergency Medicine | Admitting: Emergency Medicine

## 2015-11-08 DIAGNOSIS — F17210 Nicotine dependence, cigarettes, uncomplicated: Secondary | ICD-10-CM | POA: Insufficient documentation

## 2015-11-08 DIAGNOSIS — R112 Nausea with vomiting, unspecified: Secondary | ICD-10-CM | POA: Diagnosis not present

## 2015-11-08 DIAGNOSIS — R1010 Upper abdominal pain, unspecified: Secondary | ICD-10-CM | POA: Diagnosis not present

## 2015-11-08 DIAGNOSIS — R6883 Chills (without fever): Secondary | ICD-10-CM | POA: Diagnosis not present

## 2015-11-08 LAB — CBC
HEMATOCRIT: 41.6 % (ref 36.0–46.0)
HEMOGLOBIN: 13.9 g/dL (ref 12.0–15.0)
MCH: 30.2 pg (ref 26.0–34.0)
MCHC: 33.4 g/dL (ref 30.0–36.0)
MCV: 90.2 fL (ref 78.0–100.0)
Platelets: 378 10*3/uL (ref 150–400)
RBC: 4.61 MIL/uL (ref 3.87–5.11)
RDW: 13.5 % (ref 11.5–15.5)
WBC: 6.2 10*3/uL (ref 4.0–10.5)

## 2015-11-08 LAB — COMPREHENSIVE METABOLIC PANEL
ALBUMIN: 4.1 g/dL (ref 3.5–5.0)
ALT: 54 U/L (ref 14–54)
ANION GAP: 9 (ref 5–15)
AST: 33 U/L (ref 15–41)
Alkaline Phosphatase: 47 U/L (ref 38–126)
BILIRUBIN TOTAL: 0.4 mg/dL (ref 0.3–1.2)
BUN: 10 mg/dL (ref 6–20)
CO2: 24 mmol/L (ref 22–32)
Calcium: 9.6 mg/dL (ref 8.9–10.3)
Chloride: 108 mmol/L (ref 101–111)
Creatinine, Ser: 0.65 mg/dL (ref 0.44–1.00)
GFR calc Af Amer: >60 mL/min (ref >60)
GFR calc non Af Amer: >60 mL/min (ref >60)
GLUCOSE: 120 mg/dL — AB (ref 65–99)
POTASSIUM: 4 mmol/L (ref 3.5–5.1)
SODIUM: 141 mmol/L (ref 135–145)
TOTAL PROTEIN: 7.6 g/dL (ref 6.5–8.1)

## 2015-11-08 LAB — POC URINE PREG, ED: Preg Test, Ur: NEGATIVE

## 2015-11-08 LAB — URINE MICROSCOPIC-ADD ON: RBC / HPF: NONE SEEN RBC/hpf (ref 0–5)

## 2015-11-08 LAB — URINALYSIS, ROUTINE W REFLEX MICROSCOPIC
BILIRUBIN URINE: NEGATIVE
Glucose, UA: NEGATIVE mg/dL
Hgb urine dipstick: NEGATIVE
Ketones, ur: NEGATIVE mg/dL
NITRITE: NEGATIVE
PH: 7 (ref 5.0–8.0)
Protein, ur: 30 mg/dL — AB
SPECIFIC GRAVITY, URINE: 1.029 (ref 1.005–1.030)

## 2015-11-08 LAB — LIPASE, BLOOD: Lipase: 26 U/L (ref 11–51)

## 2015-11-08 MED ORDER — ONDANSETRON 4 MG PO TBDP
8.0000 mg | ORAL_TABLET | Freq: Once | ORAL | Status: AC
  Administered 2015-11-08: 8 mg via ORAL

## 2015-11-08 NOTE — ED Notes (Signed)
Pt. reports nausea and emesis onset this morning with chills and upper abdominal pain .

## 2015-11-08 NOTE — ED Notes (Signed)
Pt.left she had to child on bus.

## 2017-02-16 ENCOUNTER — Inpatient Hospital Stay (HOSPITAL_COMMUNITY)
Admission: AD | Admit: 2017-02-16 | Discharge: 2017-02-16 | Disposition: A | Discharge status: Home / Self Care | Payer: BLUE CROSS/BLUE SHIELD | Source: Ambulatory Visit | Attending: Obstetrics and Gynecology | Admitting: Obstetrics and Gynecology

## 2017-02-16 ENCOUNTER — Encounter (HOSPITAL_COMMUNITY): Payer: Self-pay | Admitting: *Deleted

## 2017-02-16 ENCOUNTER — Inpatient Hospital Stay (HOSPITAL_COMMUNITY): Payer: BLUE CROSS/BLUE SHIELD

## 2017-02-16 DIAGNOSIS — R109 Unspecified abdominal pain: Secondary | ICD-10-CM | POA: Insufficient documentation

## 2017-02-16 DIAGNOSIS — O26891 Other specified pregnancy related conditions, first trimester: Secondary | ICD-10-CM

## 2017-02-16 DIAGNOSIS — F17210 Nicotine dependence, cigarettes, uncomplicated: Secondary | ICD-10-CM | POA: Diagnosis not present

## 2017-02-16 DIAGNOSIS — O209 Hemorrhage in early pregnancy, unspecified: Secondary | ICD-10-CM | POA: Diagnosis not present

## 2017-02-16 HISTORY — DX: Unspecified abnormal cytological findings in specimens from vagina: R87.629

## 2017-02-16 LAB — URINALYSIS, ROUTINE W REFLEX MICROSCOPIC
Bilirubin Urine: NEGATIVE
Glucose, UA: NEGATIVE mg/dL
Hgb urine dipstick: NEGATIVE
Ketones, ur: NEGATIVE mg/dL
LEUKOCYTES UA: NEGATIVE
NITRITE: NEGATIVE
PROTEIN: NEGATIVE mg/dL
Specific Gravity, Urine: 1.03 (ref 1.005–1.030)
pH: 5 (ref 5.0–8.0)

## 2017-02-16 LAB — CBC
HCT: 36.4 % (ref 36.0–46.0)
HEMOGLOBIN: 12.3 g/dL (ref 12.0–15.0)
MCH: 30.8 pg (ref 26.0–34.0)
MCHC: 33.8 g/dL (ref 30.0–36.0)
MCV: 91 fL (ref 78.0–100.0)
Platelets: 360 10*3/uL (ref 150–400)
RBC: 4 MIL/uL (ref 3.87–5.11)
RDW: 13.9 % (ref 11.5–15.5)
WBC: 8.7 10*3/uL (ref 4.0–10.5)

## 2017-02-16 LAB — WET PREP, GENITAL
Sperm: NONE SEEN
TRICH WET PREP: NONE SEEN
YEAST WET PREP: NONE SEEN

## 2017-02-16 LAB — HCG, QUANTITATIVE, PREGNANCY: HCG, BETA CHAIN, QUANT, S: 13945 m[IU]/mL — AB (ref <5)

## 2017-02-16 LAB — ABO/RH: ABO/RH(D): O POS

## 2017-02-16 LAB — HIV ANTIBODY (ROUTINE TESTING W REFLEX): HIV Screen 4th Generation wRfx: NONREACTIVE

## 2017-02-16 LAB — POCT PREGNANCY, URINE: PREG TEST UR: POSITIVE — AB

## 2017-02-16 NOTE — MAU Note (Signed)
C/o abdominal cramping for past 2 days; c/o intermittent spotting for past week; unsure of dates because of irregular menses; Implanon removed in December or January;

## 2017-02-16 NOTE — MAU Note (Signed)
Pt had a negative UPT about a month ago; Last intercourse was yesterday morning;

## 2017-02-16 NOTE — MAU Provider Note (Signed)
Chief Complaint: Abdominal Pain   None     SUBJECTIVE HPI: Christy Holden is a 31 y.o. G3P1011 at 5228w6d by unsure LMP who presents to maternity admissions reporting spotting x 1 week and abdominal cramping x 2 days with positive HPT 3 days ago.  Her pain is intermittent, low in her abdomen, and unchanged since onset.  She has not tried any treatments, nothing makes the pain or bleeding better or worse. There are no associated symptoms. She denies vaginal itching/burning, urinary symptoms, h/a, dizziness, n/v, or fever/chills.     HPI  Past Medical History:  Diagnosis Date  . Vaginal Pap smear, abnormal    Past Surgical History:  Procedure Laterality Date  . NO PAST SURGERIES     Social History   Social History  . Marital status: Single    Spouse name: N/A  . Number of children: N/A  . Years of education: N/A   Occupational History  . Not on file.   Social History Main Topics  . Smoking status: Current Every Day Smoker    Packs/day: 0.50    Years: 7.00    Types: Cigarettes  . Smokeless tobacco: Current User  . Alcohol use 0.0 oz/week  . Drug use: Yes    Types: Marijuana  . Sexual activity: Yes    Birth control/ protection: Implant   Other Topics Concern  . Not on file   Social History Narrative  . No narrative on file   No current facility-administered medications on file prior to encounter.    No current outpatient prescriptions on file prior to encounter.   No Known Allergies  ROS:  Review of Systems  Constitutional: Negative for chills, fatigue and fever.  Respiratory: Negative for shortness of breath.   Cardiovascular: Negative for chest pain.  Gastrointestinal: Negative for nausea and vomiting.  Genitourinary: Positive for pelvic pain and vaginal bleeding. Negative for difficulty urinating, dysuria, flank pain, vaginal discharge and vaginal pain.  Neurological: Negative for dizziness and headaches.  Psychiatric/Behavioral: Negative.      I have  reviewed patient's Past Medical Hx, Surgical Hx, Family Hx, Social Hx, medications and allergies.   Physical Exam  Patient Vitals for the past 24 hrs:  BP Temp Temp src Pulse Resp  02/16/17 0725 118/72 98.2 F (36.8 C) Oral 66 16   Constitutional: Well-developed, well-nourished female in no acute distress.  Cardiovascular: normal rate Respiratory: normal effort GI: Abd soft, non-tender. Pos BS x 4 MS: Extremities nontender, no edema, normal ROM Neurologic: Alert and oriented x 4.  GU: Neg CVAT.  PELVIC EXAM: Cervix pink, visually closed, without lesion, scant white creamy discharge, vaginal walls and external genitalia normal Bimanual exam: Cervix 0/long/high, firm, anterior, neg CMT, uterus nontender, nonenlarged, adnexa without tenderness, enlargement, or mass   LAB RESULTS Results for orders placed or performed during the hospital encounter of 02/16/17 (from the past 24 hour(s))  Urinalysis, Routine w reflex microscopic     Status: None   Collection Time: 02/16/17  7:15 AM  Result Value Ref Range   Color, Urine YELLOW YELLOW   APPearance CLEAR CLEAR   Specific Gravity, Urine 1.030 1.005 - 1.030   pH 5.0 5.0 - 8.0   Glucose, UA NEGATIVE NEGATIVE mg/dL   Hgb urine dipstick NEGATIVE NEGATIVE   Bilirubin Urine NEGATIVE NEGATIVE   Ketones, ur NEGATIVE NEGATIVE mg/dL   Protein, ur NEGATIVE NEGATIVE mg/dL   Nitrite NEGATIVE NEGATIVE   Leukocytes, UA NEGATIVE NEGATIVE  Pregnancy, urine POC  Status: Abnormal   Collection Time: 02/16/17  7:21 AM  Result Value Ref Range   Preg Test, Ur POSITIVE (A) NEGATIVE       Report to Nolene Bernheim NP with labs and [place] results pending  CLINICAL DATA:  Spotting.  Cramping for 2 days  EXAM: OBSTETRIC <14 WK [place] AND TRANSVAGINAL OB US  TECHNIQUE: Both transabdominal and transvaginal ultrasound examinations were performed for complete evaluation of the gestation as well as the maternal uterus, adnexal regions, and pelvic  cul-de-sac. Transvaginal technique was performed to assess early pregnancy.  COMPARISON:  None.  FINDINGS: Intrauterine gestational sac: Single  Yolk sac:  Visualized.  Embryo:  Not Visualized.  MSD: 9.5  mm   5 w   5  d  Subchorionic hemorrhage:  None visualized.  Maternal uterus/adnexae:  Right ovary: Normal  Left ovary: Normal  Other :None  Free fluid:  None  IMPRESSION: 1. Probable early intrauterine gestational sac containing yolk sac, but no fetal pole, or cardiac activity yet visualized. Recommend follow-up quantitative B-HCG levels and follow-up US in 14 days to confirm and assess viability. This recommendation follows SRU consensus guidelines: Diagnostic Criteria for Nonviable Pregnancy Early in the First Trimester. Malva Limes Med 2013; 161:0960-45.   Plan Begin prenatal care ASAP No smoking, no drugs, no alcohol.   Take a prenatal vitamin one by mouth every day.   Eat small frequent snacks to avoid nausea.   Drink at least 8 8-oz glasses of water every day. Take Tylenol 325 mg 2 tablets by mouth every 4 hours if needed for pain.    Sharen Counter Certified Nurse-Midwife 02/16/2017  7:48 AM

## 2017-02-16 NOTE — Discharge Instructions (Signed)
No smoking, no drugs, no alcohol.  Take a prenatal vitamin one by mouth every day.  Eat small frequent snacks to avoid nausea.  Begin prenatal care as soon as possible. °Drink at least 8 8-oz glasses of water every day. °Take Tylenol 325 mg 2 tablets by mouth every 4 hours if needed for pain. ° ° °

## 2017-02-17 ENCOUNTER — Other Ambulatory Visit: Payer: Self-pay | Admitting: Advanced Practice Midwife

## 2017-02-17 ENCOUNTER — Telehealth (HOSPITAL_COMMUNITY): Payer: Self-pay

## 2017-02-17 LAB — GC/CHLAMYDIA PROBE AMP (~~LOC~~) NOT AT ARMC
Chlamydia: POSITIVE — AB
Neisseria Gonorrhea: NEGATIVE

## 2017-02-17 MED ORDER — AZITHROMYCIN 500 MG PO TABS: 1000.0000 mg | ORAL_TABLET | Freq: Once | ORAL | 0 refills | Status: AC

## 2017-02-17 NOTE — Progress Notes (Signed)
Rx for azithromycin 1000 mg PO x 1 dose sent to [facility] [address] for positive chlamydia.

## 2017-02-17 NOTE — Telephone Encounter (Signed)
1st attempt to contact patient regarding patient's STD lab results from her last visit. Voicemail not set up. Could not leave a voicemail.

## 2017-02-17 NOTE — Telephone Encounter (Signed)
Pt called back, and verified DOB. Pt aware her lab results during [facility] showed she was positive for Chlamydia. Verified pharmacy with patient. Pt aware a prescription (Azithromycin 1 gram x 1 dose) will be called into her pharmacy for treatment of her positive results. She will take the pills as directed. Patient aware once treatment is received, abstain from intercourse x 2 weeks. Also, she is aware to inform her partner because they will also need to be treated. Communicable Disease Report faxed to health department.

## 2017-02-24 ENCOUNTER — Encounter (HOSPITAL_COMMUNITY): Payer: Self-pay | Admitting: *Deleted

## 2017-02-24 ENCOUNTER — Inpatient Hospital Stay (HOSPITAL_COMMUNITY)
Admission: AD | Admit: 2017-02-24 | Discharge: 2017-02-24 | Disposition: A | Discharge status: Home / Self Care | Payer: BLUE CROSS/BLUE SHIELD | Source: Ambulatory Visit | Attending: Obstetrics and Gynecology | Admitting: Obstetrics and Gynecology

## 2017-02-24 DIAGNOSIS — O219 Vomiting of pregnancy, unspecified: Secondary | ICD-10-CM | POA: Diagnosis not present

## 2017-02-24 DIAGNOSIS — Z3A01 Less than 8 weeks gestation of pregnancy: Secondary | ICD-10-CM | POA: Insufficient documentation

## 2017-02-24 DIAGNOSIS — F17210 Nicotine dependence, cigarettes, uncomplicated: Secondary | ICD-10-CM | POA: Insufficient documentation

## 2017-02-24 DIAGNOSIS — A749 Chlamydial infection, unspecified: Secondary | ICD-10-CM | POA: Diagnosis not present

## 2017-02-24 DIAGNOSIS — R1110 Vomiting, unspecified: Secondary | ICD-10-CM | POA: Diagnosis present

## 2017-02-24 DIAGNOSIS — O99331 Smoking (tobacco) complicating pregnancy, first trimester: Secondary | ICD-10-CM | POA: Insufficient documentation

## 2017-02-24 DIAGNOSIS — O98811 Other maternal infectious and parasitic diseases complicating pregnancy, first trimester: Secondary | ICD-10-CM | POA: Diagnosis not present

## 2017-02-24 DIAGNOSIS — Z8249 Family history of ischemic heart disease and other diseases of the circulatory system: Secondary | ICD-10-CM | POA: Diagnosis not present

## 2017-02-24 HISTORY — DX: Chlamydial infection, unspecified: A74.9

## 2017-02-24 LAB — URINALYSIS, ROUTINE W REFLEX MICROSCOPIC
Glucose, UA: NEGATIVE mg/dL
NITRITE: NEGATIVE
Protein, ur: 30 mg/dL — AB
Specific Gravity, Urine: >1.03 — ABNORMAL HIGH (ref 1.005–1.030)
pH: 6 (ref 5.0–8.0)

## 2017-02-24 LAB — URINALYSIS, MICROSCOPIC (REFLEX)

## 2017-02-24 MED ORDER — PROMETHAZINE HCL 12.5 MG PO TABS: 12.5000 mg | ORAL_TABLET | Freq: Four times a day (QID) | ORAL | 0 refills | Status: DC | PRN

## 2017-02-24 MED ORDER — LACTATED RINGERS IV BOLUS (SEPSIS)
1000.0000 mL | Freq: Once | INTRAVENOUS | Status: AC
  Administered 2017-02-24: 1000 mL via INTRAVENOUS

## 2017-02-24 MED ORDER — AZITHROMYCIN 250 MG PO TABS
1000.0000 mg | ORAL_TABLET | Freq: Once | ORAL | Status: AC
  Administered 2017-02-24: 1000 mg via ORAL
  Filled 2017-02-24: qty 4

## 2017-02-24 MED ORDER — LACTATED RINGERS IV BOLUS (SEPSIS): 1000.0000 mL | Freq: Once | INTRAVENOUS | Status: DC

## 2017-02-24 MED ORDER — PROMETHAZINE HCL 25 MG/ML IJ SOLN
25.0000 mg | Freq: Once | INTRAMUSCULAR | Status: AC
  Administered 2017-02-24: 25 mg via INTRAVENOUS
  Filled 2017-02-24: qty 1

## 2017-02-24 NOTE — MAU Note (Signed)
Patient states she has been having n/v for past week.  Denies fever or diarrhea.  Was seen in MAU about a week ago and was prescribed meds for Chlamydia treatment, but patient has been unable to take the meds since she has been so nauseated.  Denies vaginal bleeding or discharge and is not having pain.

## 2017-02-24 NOTE — MAU Provider Note (Signed)
History     CSN: [id number]  Arrival date and time: 02/24/17 [id number]   First Provider Initiated Contact with Patient 02/24/17 [phone]      Chief Complaint  Patient presents with  . Emesis   HPI   [name] is a 31 y.o. female G3P1011 @ 857w6d here in MAU with N/V. She states she has not eaten in 3 days because she cannot keep anything down. She says she wants to eat chicken and fries but she cant keep it down. She does not have anything at home for the nausea. Says she was diagnosed with Chlamydia last week and was unable to keep it down.   She denies abdominal pain or vaginal bleeding.   OB History    Gravida Para Term Preterm AB Living   3 1 1   1 1    SAB TAB Ectopic Multiple Live Births     1     1      Past Medical History:  Diagnosis Date  . Chlamydia   . Vaginal Pap smear, abnormal     Past Surgical History:  Procedure Laterality Date  . DILATION AND CURETTAGE OF UTERUS    . NO PAST SURGERIES      Family History  Problem Relation Age of Onset  . Hypertension Mother   . Hypertension Father   . Kidney disease Maternal Grandmother     Social History  Substance Use Topics  . Smoking status: Current Every Day Smoker    Packs/day: 0.50    Years: 7.00    Types: Cigarettes  . Smokeless tobacco: Current User     Comment: unable to smoke for past week r/t n/v  . Alcohol use 0.0 oz/week    Allergies: No Known Allergies  No prescriptions prior to admission.   Results for orders placed or performed during the hospital encounter of 02/24/17 (from the past 48 hour(s))  Urinalysis, Routine w reflex microscopic     Status: Abnormal   Collection Time: 02/24/17  8:33 AM  Result Value Ref Range   Color, Urine YELLOW YELLOW   APPearance CLEAR CLEAR   Specific Gravity, Urine >1.030 (H) 1.005 - 1.030   pH 6.0 5.0 - 8.0   Glucose, UA NEGATIVE NEGATIVE mg/dL   Hgb urine dipstick TRACE (A) NEGATIVE   Bilirubin Urine MODERATE (A) NEGATIVE   Ketones, ur >80 (A)  NEGATIVE mg/dL   Protein, ur 30 (A) NEGATIVE mg/dL   Nitrite NEGATIVE NEGATIVE   Leukocytes, UA TRACE (A) NEGATIVE  Urinalysis, Microscopic (reflex)     Status: Abnormal   Collection Time: 02/24/17  8:33 AM  Result Value Ref Range   RBC / HPF 0-5 0 - 5 RBC/hpf   WBC, UA 0-5 0 - 5 WBC/hpf   Bacteria, UA FEW (A) NONE SEEN   Squamous Epithelial / LPF 0-5 (A) NONE SEEN   Review of Systems  Constitutional: Positive for appetite change.  Gastrointestinal: Positive for nausea and vomiting.   Physical Exam   Blood pressure 131/66, pulse 61, temperature 98.7 F (37.1 C), temperature source Oral, resp. rate 17, height 5' 2.5" (1.588 m), weight 171 lb 12 oz (77.9 kg), last menstrual period 12/02/2016, SpO2 99 %.  Physical Exam  Constitutional: She is oriented to person, place, and time. She appears well-developed and well-nourished. No distress.  HENT:  Head: Normocephalic.  Respiratory: Effort normal.  Musculoskeletal: Normal range of motion.  Neurological: She is alert and oriented to person, place, and time.  Skin:  Skin is warm. She is not diaphoretic.  Psychiatric: Her behavior is normal.   MAU Course  Procedures  None  MDM  LR bolus X 1 Phenergan 25 mg IV Azithromycin given PO, patient tolerated it .  PO challenge: Pass  Assessment and Plan   A:  1. Nausea and vomiting in pregnancy   2. Chlamydia infection     P:  Discharge home in stable condition Small, frequent meals. BRAT diet Return to MAU if symptoms worsen  Rx: Phenergan  Partner needs treatment Condoms always   Christy Holden, Christy Rutherford, NP 02/24/2017 3:17 PM

## 2017-02-24 NOTE — Discharge Instructions (Signed)
Chlamydia, Female Chlamydia is an STD (sexually transmitted disease). This is an infection that spreads through sexual contact. If it is not treated, it can cause serious problems. It must be treated with antibiotic medicine. Sometimes, you may not have symptoms (asymptomatic). When you have symptoms, they can include:  Burning when you pee (urinate).  Peeing often.  Fluid (discharge) coming from the vagina.  Redness, soreness, and swelling (inflammation) of the butt (rectum).  Bleeding or fluid coming from the butt.  Belly (abdominal) pain.  Pain during sex.  Bleeding between periods.  Itching, burning, or redness in the eyes.  Fluid coming from the eyes.  Follow these instructions at home: Medicines  Take over-the-counter and prescription medicines only as told by your doctor.  Take your antibiotic medicine as told by your doctor. Do not stop taking the antibiotic even if you start to feel better. Sexual activity  Tell sex partners about your infection. Sex partners are people you had oral, anal, or vaginal sex with within 60 days of when you started getting sick. They need treatment, too.  Do not have sex until: ? You and your sex partners have been treated. ? Your doctor says it is okay.  If you have a single dose treatment, wait 7 days before having sex. General instructions  It is up to you to get your test results. Ask your doctor when your results will be ready.  Get a lot of rest.  Eat healthy foods.  Drink enough fluid to keep your pee (urine) clear or pale yellow.  Keep all follow-up visits as told by your doctor. You may need tests after 3 months. Preventing chlamydia  The only way to prevent chlamydia is not to have sex. To lower your risk: ? Use latex condoms correctly. Do this every time you have sex. ? Avoid having many sex partners. ? Ask if your partner has been tested for STDs and if he or she had negative results. Contact a doctor if:  You  get new symptoms.  You do not get better with treatment.  You have a fever or chills.  You have pain during sex. Get help right away if:  Your pain gets worse and does not get better with medicine.  You get flu-like symptoms, such as: ? Night sweats. ? Sore throat. ? Muscle aches.  You feel sick to your stomach (nauseous).  You throw up (vomit).  You have trouble swallowing.  You have bleeding: ? Between periods. ? After sex.  You have irregular periods.  You have belly pain that does not get better with medicine.  You have lower back pain that does not get better with medicine.  You feel weak or dizzy.  You pass out (faint).  You are pregnant and you get symptoms of chlamydia. Summary  Chlamydia is an infection that spreads through sexual contact.  Sometimes, chlamydia can cause no symptoms (asymptomatic).  Do not have sex until your doctor says it is okay.  All sex partners will have to be treated for chlamydia. This information is not intended to replace advice given to you by your health care provider. Make sure you discuss any questions you have with your health care provider. Document Released: 05/28/2008 Document Revised: 08/08/2016 Document Reviewed: 08/08/2016 Elsevier Interactive Patient Education  2017 [employer] Inc.  Nausea and Vomiting, Adult Feeling sick to your stomach (nausea) means that your stomach is upset or you feel like you have to throw up (vomit). Feeling more and more sick  to your stomach can lead to throwing up. Throwing up happens when food and liquid from your stomach are thrown up and out the mouth. Throwing up can make you feel weak and cause you to get dehydrated. Dehydration can make you tired and thirsty, make you have a dry mouth, and make it so you pee (urinate) less often. Older adults and people with other diseases or a weak defense system (immune system) are at higher risk for dehydration. If you feel sick to your stomach or if  you throw up, it is important to follow instructions from your doctor about how to take care of yourself. Follow these instructions at home: Eating and drinking Follow these instructions as told by your doctor:  Take an oral rehydration solution (ORS). This is a drink that is sold at pharmacies and stores.  Drink clear fluids in small amounts as you are able, such as: ? Water. ? Ice chips. ? Diluted fruit juice. ? Low-calorie sports drinks.  Eat bland, easy-to-digest foods in small amounts as you are able, such as: ? Bananas. ? Applesauce. ? Rice. ? Low-fat (lean) meats. ? Toast. ? Crackers.  Avoid fluids that have a lot of sugar or caffeine in them.  Avoid alcohol.  Avoid spicy or fatty foods.  General instructions  Drink enough fluid to keep your pee (urine) clear or pale yellow.  Wash your hands often. If you cannot use soap and water, use hand sanitizer.  Make sure that all people in your home wash their hands well and often.  Take over-the-counter and prescription medicines only as told by your doctor.  Rest at home while you get better.  Watch your condition for any changes.  Breathe slowly and deeply when you feel sick to your stomach.  Keep all follow-up visits as told by your doctor. This is important. Contact a doctor if:  You have a fever.  You cannot keep fluids down.  Your symptoms get worse.  You have new symptoms.  You feel sick to your stomach for more than two days.  You feel light-headed or dizzy.  You have a headache.  You have muscle cramps. Get help right away if:  You have pain in your chest, neck, arm, or jaw.  You feel very weak or you pass out (faint).  You throw up again and again.  You see blood in your throw-up.  Your throw-up looks like black coffee grounds.  You have bloody or black poop (stools) or poop that look like tar.  You have a very bad headache, a stiff neck, or both.  You have a rash.  You have very  bad pain, cramping, or bloating in your belly (abdomen).  You have trouble breathing.  You are breathing very quickly.  Your heart is beating very quickly.  Your skin feels cold and clammy.  You feel confused.  You have pain when you pee.  You have signs of dehydration, such as: ? Dark pee, hardly any pee, or no pee. ? Cracked lips. ? Dry mouth. ? Sunken eyes. ? Sleepiness. ? Weakness. These symptoms may be an emergency. Do not wait to see if the symptoms will go away. Get medical help right away. Call your local emergency services (911 in the [place] Do not drive yourself to the hospital. This information is not intended to replace advice given to you by your health care provider. Make sure you discuss any questions you have with your health care provider. Document Released: 02/05/2008 Document  Revised: 03/08/2016 Document Reviewed: 04/25/2015 Elsevier Interactive Patient Education  [employer]2018 [employer] Inc.

## 2017-07-07 ENCOUNTER — Encounter (HOSPITAL_COMMUNITY): Payer: Self-pay | Admitting: *Deleted

## 2017-07-07 ENCOUNTER — Inpatient Hospital Stay (HOSPITAL_COMMUNITY)
Admission: AD | Admit: 2017-07-07 | Discharge: 2017-07-07 | Disposition: A | Discharge status: Home / Self Care | Payer: BLUE CROSS/BLUE SHIELD | Source: Ambulatory Visit | Attending: Obstetrics & Gynecology | Admitting: Obstetrics & Gynecology

## 2017-07-07 DIAGNOSIS — O219 Vomiting of pregnancy, unspecified: Secondary | ICD-10-CM

## 2017-07-07 DIAGNOSIS — O26891 Other specified pregnancy related conditions, first trimester: Secondary | ICD-10-CM | POA: Diagnosis not present

## 2017-07-07 DIAGNOSIS — Z87891 Personal history of nicotine dependence: Secondary | ICD-10-CM | POA: Insufficient documentation

## 2017-07-07 DIAGNOSIS — R112 Nausea with vomiting, unspecified: Secondary | ICD-10-CM | POA: Diagnosis not present

## 2017-07-07 DIAGNOSIS — Z3A01 Less than 8 weeks gestation of pregnancy: Secondary | ICD-10-CM

## 2017-07-07 LAB — COMPREHENSIVE METABOLIC PANEL
ALBUMIN: 4.2 g/dL (ref 3.5–5.0)
ALT: 18 U/L (ref 14–54)
ANION GAP: 9 (ref 5–15)
AST: 18 U/L (ref 15–41)
Alkaline Phosphatase: 38 U/L (ref 38–126)
BUN: 12 mg/dL (ref 6–20)
CO2: 22 mmol/L (ref 22–32)
Calcium: 9 mg/dL (ref 8.9–10.3)
Chloride: 105 mmol/L (ref 101–111)
Creatinine, Ser: 0.59 mg/dL (ref 0.44–1.00)
GFR calc Af Amer: >60 mL/min (ref >60)
GFR calc non Af Amer: >60 mL/min (ref >60)
GLUCOSE: 86 mg/dL (ref 65–99)
POTASSIUM: 3.7 mmol/L (ref 3.5–5.1)
SODIUM: 136 mmol/L (ref 135–145)
TOTAL PROTEIN: 7.5 g/dL (ref 6.5–8.1)
Total Bilirubin: 0.5 mg/dL (ref 0.3–1.2)

## 2017-07-07 LAB — CBC
HCT: 37.5 % (ref 36.0–46.0)
Hemoglobin: 13 g/dL (ref 12.0–15.0)
MCH: 31 pg (ref 26.0–34.0)
MCHC: 34.7 g/dL (ref 30.0–36.0)
MCV: 89.5 fL (ref 78.0–100.0)
Platelets: 330 10*3/uL (ref 150–400)
RBC: 4.19 MIL/uL (ref 3.87–5.11)
RDW: 13.2 % (ref 11.5–15.5)
WBC: 9.6 10*3/uL (ref 4.0–10.5)

## 2017-07-07 LAB — URINALYSIS, ROUTINE W REFLEX MICROSCOPIC
BACTERIA UA: NONE SEEN
BILIRUBIN URINE: NEGATIVE
Glucose, UA: NEGATIVE mg/dL
HGB URINE DIPSTICK: NEGATIVE
Ketones, ur: 5 mg/dL — AB
LEUKOCYTES UA: NEGATIVE
NITRITE: NEGATIVE
PROTEIN: 30 mg/dL — AB
Specific Gravity, Urine: 1.031 — ABNORMAL HIGH (ref 1.005–1.030)
pH: 6 (ref 5.0–8.0)

## 2017-07-07 LAB — POCT PREGNANCY, URINE: PREG TEST UR: POSITIVE — AB

## 2017-07-07 MED ORDER — ONDANSETRON HCL 4 MG/2ML IJ SOLN
4.0000 mg | Freq: Once | INTRAMUSCULAR | Status: AC
  Administered 2017-07-07: 4 mg via INTRAVENOUS
  Filled 2017-07-07: qty 2

## 2017-07-07 MED ORDER — LACTATED RINGERS IV BOLUS (SEPSIS)
1000.0000 mL | Freq: Once | INTRAVENOUS | Status: AC
  Administered 2017-07-07: 1000 mL via INTRAVENOUS

## 2017-07-07 MED ORDER — PROMETHAZINE HCL 25 MG PO TABS: 25.0000 mg | ORAL_TABLET | Freq: Four times a day (QID) | ORAL | 0 refills | Status: DC | PRN

## 2017-07-07 NOTE — MAU Provider Note (Signed)
History     CSN: [id number]  Arrival date & time 07/07/17  1024   First Provider Initiated Contact with Patient 07/07/17 1407      Chief Complaint  Patient presents with  . Abdominal Pain  . Morning Sickness    [name] is 31 yo G4P1 at 5473w2d who presents with N/V and abdominal cramps. She states symptoms started 3 days ago. She reports that the cramps as intermittent, nothing alleviates or aggravates. She has not taken any meds for her symptoms. She reports associated diarrhea at night, chills/sweats and dizziness after being on her feet for a long time at work. She denies vaginal bleeding, back pain, headache, blurry vision, bloody stools. She has not noticed any blood in her vomit.   Abdominal Pain  Associated symptoms include diarrhea, nausea and vomiting. Pertinent negatives include no arthralgias, dysuria, fever, headaches or hematuria.    Past Medical History:  Diagnosis Date  . Chlamydia   . Vaginal Pap smear, abnormal     Past Surgical History:  Procedure Laterality Date  . DILATION AND CURETTAGE OF UTERUS    . NO PAST SURGERIES      Family History  Problem Relation Age of Onset  . Hypertension Mother   . Hypertension Father   . Kidney disease Maternal Grandmother     Social History   Tobacco Use  . Smoking status: Former Smoker    Packs/day: 0.50    Years: 7.00    Pack years: 3.50    Types: Cigarettes  . Smokeless tobacco: Current User  . Tobacco comment: unable to smoke for past week r/t n/v  Substance Use Topics  . Alcohol use: No    Alcohol/week: 0.0 oz    Frequency: Never  . Drug use: No    Comment: not currently    OB History    Gravida Para Term Preterm AB Living   4 1 1   1 1    SAB TAB Ectopic Multiple Live Births     1     1      Review of Systems  Constitutional: Positive for appetite change, chills and diaphoresis. Negative for fever.  HENT: Negative for congestion and sore throat.   Eyes: Negative for discharge and visual  disturbance.  Respiratory: Negative for chest tightness, shortness of breath and wheezing.   Cardiovascular: Negative for chest pain, palpitations and leg swelling.  Gastrointestinal: Positive for abdominal pain, diarrhea, nausea and vomiting. Negative for blood in stool.  Endocrine: Negative for cold intolerance and heat intolerance.  Genitourinary: Negative for difficulty urinating, dysuria, flank pain, hematuria, vaginal bleeding, vaginal discharge and vaginal pain.  Musculoskeletal: Negative for arthralgias, back pain and gait problem.  Neurological: Positive for light-headedness. Negative for headaches.  Psychiatric/Behavioral: Negative for agitation and confusion.    Allergies  Patient has no known allergies.  Home Medications    BP (!) 143/67   Pulse 76   Temp 98.2 F (36.8 C)   Resp 18   Wt 86.6 kg (191 lb)   LMP 05/31/2017   Breastfeeding? Unknown   BMI 34.38 kg/m   Physical Exam  Constitutional: She is oriented to person, place, and time. She appears well-developed and well-nourished. No distress.  HENT:  Head: Normocephalic and atraumatic.  Eyes: Right eye exhibits no discharge. Left eye exhibits no discharge.  Neck: Neck supple.  Cardiovascular: Normal rate, regular rhythm, normal heart sounds and intact distal pulses. Exam reveals no gallop and no friction rub.  No murmur heard.  Abdominal: Soft. Bowel sounds are normal. She exhibits no distension. There is no tenderness. There is no rebound and no guarding.  Neurological: She is alert and oriented to person, place, and time.    MAU Course  Procedures (including critical care time)  Labs Reviewed  URINALYSIS, ROUTINE W REFLEX MICROSCOPIC - Abnormal; Notable for the following components:      Result Value   APPearance HAZY (*)    Specific Gravity, Urine 1.031 (*)    Ketones, ur 5 (*)    Protein, ur 30 (*)    Squamous Epithelial / LPF 0-5 (*)    All other components within normal limits  POCT PREGNANCY,  URINE - Abnormal; Notable for the following components:   Preg Test, Ur POSITIVE (*)    All other components within normal limits  CBC  COMPREHENSIVE METABOLIC PANEL   No results found.   No diagnosis found.    MDM  Pt is showing signs of dehydration (N/V and diarrhea with 5 ketone, specific gravity 1.031)  Plan to give IV fluids with zofran.  Will advice pt to drink plenty of fluids.

## 2017-07-07 NOTE — Discharge Instructions (Signed)
Morning Sickness °Morning sickness is when you feel sick to your stomach (nauseous) during pregnancy. This nauseous feeling may or may not come with vomiting. It often occurs in the morning but can be a problem any time of day. Morning sickness is most common during the first trimester, but it may continue throughout pregnancy. While morning sickness is unpleasant, it is usually harmless unless you develop severe and continual vomiting (hyperemesis gravidarum). This condition requires more intense treatment. °What are the causes? °The cause of morning sickness is not completely known but seems to be related to normal hormonal changes that occur in pregnancy. °What increases the risk? °You are at greater risk if you: °· Experienced nausea or vomiting before your pregnancy. °· Had morning sickness during a previous pregnancy. °· Are pregnant with more than one baby, such as twins. ° °How is this treated? °Do not use any medicines (prescription, over-the-counter, or herbal) for morning sickness without first talking to your health care provider. Your health care provider may prescribe or recommend: °· Vitamin B6 supplements. °· Anti-nausea medicines. °· The herbal medicine ginger. ° °Follow these instructions at home: °· Only take over-the-counter or prescription medicines as directed by your health care provider. °· Taking multivitamins before getting pregnant can prevent or decrease the severity of morning sickness in most women. °· Eat a piece of dry toast or unsalted crackers before getting out of bed in the morning. °· Eat five or six small meals a day. °· Eat dry and bland foods (rice, baked potato). Foods high in carbohydrates are often helpful. °· Do not drink liquids with your meals. Drink liquids between meals. °· Avoid greasy, fatty, and spicy foods. °· Get someone to cook for you if the smell of any food causes nausea and vomiting. °· If you feel nauseous after taking prenatal vitamins, take the vitamins at  night or with a snack. °· Snack on protein foods (nuts, yogurt, cheese) between meals if you are hungry. °· Eat unsweetened gelatins for desserts. °· Wearing an acupressure wristband (worn for sea sickness) may be helpful. °· Acupuncture may be helpful. °· Do not smoke. °· Get a humidifier to keep the air in your house free of odors. °· Get plenty of fresh air. °Contact a health care provider if: °· Your home remedies are not working, and you need medicine. °· You feel dizzy or lightheaded. °· You are losing weight. °Get help right away if: °· You have persistent and uncontrolled nausea and vomiting. °· You pass out (faint). °This information is not intended to replace advice given to you by your health care provider. Make sure you discuss any questions you have with your health care provider. °Document Released: 10/10/2006 Document Revised: 01/25/2016 Document Reviewed: 02/03/2013 °Elsevier Interactive Patient Education © 2017 Elsevier Inc. ° °

## 2017-07-07 NOTE — MAU Note (Signed)
Pt presents to MAU with complaints of nausea and lower abdominal pain for 3 days. Denies any VB or abnormal discharge. + pregnancy test last Saturday

## 2017-07-07 NOTE — MAU Provider Note (Signed)
History     CSN: [id number]  Arrival date and time: 07/07/17 1024   First Provider Initiated Contact with Patient 07/07/17 1407      Chief Complaint  Patient presents with  . Abdominal Pain  . Morning Sickness   HPI  Ms. Ricky Alabony S Wisham is a 31 y.o. G4P1011 at 824w2d gestation presenting to MAU with complaints of N/V and abdominal cramps x 3 days. She reports the abdominal pain comes and goes, but is not aggravated or alleviated by anything.  She does not have any meds to take for her sx's. She does also have complaints of diarrhea at night, chills/sweats and dizziness after being on her feet for a long time at work. She denies VB, back pain, H/A, blurry vision, bloody stools.  Past Medical History:  Diagnosis Date  . Chlamydia   . Vaginal Pap smear, abnormal     Past Surgical History:  Procedure Laterality Date  . DILATION AND CURETTAGE OF UTERUS    . NO PAST SURGERIES      Family History  Problem Relation Age of Onset  . Hypertension Mother   . Hypertension Father   . Kidney disease Maternal Grandmother     Social History   Tobacco Use  . Smoking status: Former Smoker    Packs/day: 0.50    Years: 7.00    Pack years: 3.50    Types: Cigarettes  . Smokeless tobacco: Current User  . Tobacco comment: unable to smoke for past week r/t n/v  Substance Use Topics  . Alcohol use: No    Alcohol/week: 0.0 oz    Frequency: Never  . Drug use: No    Comment: not currently    Allergies: No Known Allergies  No medications prior to admission.    Review of Systems  Constitutional: Positive for appetite change, chills, diaphoresis and fatigue.  HENT: Negative.   Eyes: Negative.   Respiratory: Negative.   Cardiovascular: Negative.   Gastrointestinal: Positive for abdominal pain, diarrhea, nausea and vomiting.  Endocrine: Negative.   Musculoskeletal: Negative.   Skin: Negative.   Allergic/Immunologic: Negative.   Neurological: Negative.   Hematological: Negative.    Psychiatric/Behavioral: Negative.    Physical Exam   Blood pressure (!) 143/67, pulse 76, temperature 98.2 F (36.8 C), resp. rate 18, weight 191 lb (86.6 kg), last menstrual period 05/31/2017.  Physical Exam  Constitutional: She is oriented to person, place, and time. She appears well-developed and well-nourished.  HENT:  Head: Normocephalic.  Eyes: Pupils are equal, round, and reactive to light.  Neck: Normal range of motion.  Cardiovascular: Normal rate, regular rhythm, normal heart sounds and intact distal pulses.  Respiratory: Effort normal and breath sounds normal.  GI: Soft. Bowel sounds are normal.  Genitourinary:  Genitourinary Comments: Pelvic deferred  Musculoskeletal: Normal range of motion.  Neurological: She is alert and oriented to person, place, and time. She has normal reflexes.  Skin: Skin is warm and dry.  Psychiatric: She has a normal mood and affect. Her behavior is normal. Judgment and thought content normal.    MAU Course  Procedures  MDM [facility] UPT CBC CMP IVF: LR 10000 ml at 999 ml/hr Zofran 4 mg IVPB -- improved N/V/D, able to tolerate crackers and gingerale without any problems  Results for orders placed or performed during the hospital encounter of 07/07/17 (from the past 24 hour(s))  Urinalysis, Routine w reflex microscopic     Status: Abnormal   Collection Time: 07/07/17 10:39 AM  Result Value  Ref Range   Color, Urine YELLOW YELLOW   APPearance HAZY (A) CLEAR   Specific Gravity, Urine 1.031 (H) 1.005 - 1.030   pH 6.0 5.0 - 8.0   Glucose, UA NEGATIVE NEGATIVE mg/dL   Hgb urine dipstick NEGATIVE NEGATIVE   Bilirubin Urine NEGATIVE NEGATIVE   Ketones, ur 5 (A) NEGATIVE mg/dL   Protein, ur 30 (A) NEGATIVE mg/dL   Nitrite NEGATIVE NEGATIVE   Leukocytes, UA NEGATIVE NEGATIVE   RBC / HPF 0-5 0 - 5 RBC/hpf   WBC, UA 0-5 0 - 5 WBC/hpf   Bacteria, UA NONE SEEN NONE SEEN   Squamous Epithelial / LPF 0-5 (A) NONE SEEN   Mucus PRESENT    Pregnancy, urine POC     Status: Abnormal   Collection Time: 07/07/17 10:48 AM  Result Value Ref Range   Preg Test, Ur POSITIVE (A) NEGATIVE  CBC     Status: None   Collection Time: 07/07/17  2:01 PM  Result Value Ref Range   WBC 9.6 4.0 - 10.5 K/uL   RBC 4.19 3.87 - 5.11 MIL/uL   Hemoglobin 13.0 12.0 - 15.0 g/dL   HCT 16.1 09.6 - 04.5 %   MCV 89.5 78.0 - 100.0 fL   MCH 31.0 26.0 - 34.0 pg   MCHC 34.7 30.0 - 36.0 g/dL   RDW 40.9 81.1 - 91.4 %   Platelets 330 150 - 400 K/uL  Comprehensive metabolic panel     Status: None   Collection Time: 07/07/17  2:01 PM  Result Value Ref Range   Sodium 136 135 - 145 mmol/L   Potassium 3.7 3.5 - 5.1 mmol/L   Chloride 105 101 - 111 mmol/L   CO2 22 22 - 32 mmol/L   Glucose, Bld 86 65 - 99 mg/dL   BUN 12 6 - 20 mg/dL   Creatinine, Ser 7.82 0.44 - 1.00 mg/dL   Calcium 9.0 8.9 - 95.6 mg/dL   Total Protein 7.5 6.5 - 8.1 g/dL   Albumin 4.2 3.5 - 5.0 g/dL   AST 18 15 - 41 U/L   ALT 18 14 - 54 U/L   Alkaline Phosphatase 38 38 - 126 U/L   Total Bilirubin 0.5 0.3 - 1.2 mg/dL   GFR calc non Af Amer >60 >60 mL/min   GFR calc Af Amer >60 >60 mL/min   Anion gap 9 5 - 15    Assessment and Plan  Nausea and vomiting during pregnancy prior to [redacted] weeks gestation  - Information provided on morning sickness - Rx given   Allergies as of 07/07/2017   No Known Allergies     Medication List    TAKE these medications   promethazine 25 MG tablet Commonly known as:  PHENERGAN Take 1 tablet (25 mg total) every 6 (six) hours as needed by mouth for nausea or vomiting. What changed:    medication strength  how much to take       Discharge home Patient verbalized an understanding of the plan of care and agrees.    Raelyn Mora, MSN, CNM 07/08/2017, 2:05 PM

## 2017-07-27 ENCOUNTER — Inpatient Hospital Stay (HOSPITAL_COMMUNITY)
Admission: AD | Admit: 2017-07-27 | Discharge: 2017-07-27 | Disposition: A | Discharge status: Home / Self Care | Payer: BLUE CROSS/BLUE SHIELD | Source: Ambulatory Visit | Attending: Obstetrics and Gynecology | Admitting: Obstetrics and Gynecology

## 2017-07-27 ENCOUNTER — Inpatient Hospital Stay (HOSPITAL_COMMUNITY): Payer: BLUE CROSS/BLUE SHIELD

## 2017-07-27 DIAGNOSIS — Z841 Family history of disorders of kidney and ureter: Secondary | ICD-10-CM | POA: Diagnosis not present

## 2017-07-27 DIAGNOSIS — O9A211 Injury, poisoning and certain other consequences of external causes complicating pregnancy, first trimester: Secondary | ICD-10-CM | POA: Insufficient documentation

## 2017-07-27 DIAGNOSIS — Z3A09 9 weeks gestation of pregnancy: Secondary | ICD-10-CM | POA: Insufficient documentation

## 2017-07-27 DIAGNOSIS — Z8249 Family history of ischemic heart disease and other diseases of the circulatory system: Secondary | ICD-10-CM | POA: Insufficient documentation

## 2017-07-27 DIAGNOSIS — Z8619 Personal history of other infectious and parasitic diseases: Secondary | ICD-10-CM | POA: Diagnosis not present

## 2017-07-27 DIAGNOSIS — Y93E1 Activity, personal bathing and showering: Secondary | ICD-10-CM | POA: Diagnosis not present

## 2017-07-27 DIAGNOSIS — Y92009 Unspecified place in unspecified non-institutional (private) residence as the place of occurrence of the external cause: Secondary | ICD-10-CM

## 2017-07-27 DIAGNOSIS — O209 Hemorrhage in early pregnancy, unspecified: Secondary | ICD-10-CM | POA: Diagnosis not present

## 2017-07-27 DIAGNOSIS — O208 Other hemorrhage in early pregnancy: Secondary | ICD-10-CM | POA: Insufficient documentation

## 2017-07-27 DIAGNOSIS — Z87891 Personal history of nicotine dependence: Secondary | ICD-10-CM | POA: Insufficient documentation

## 2017-07-27 DIAGNOSIS — W19XXXA Unspecified fall, initial encounter: Secondary | ICD-10-CM

## 2017-07-27 DIAGNOSIS — Z9889 Other specified postprocedural states: Secondary | ICD-10-CM | POA: Insufficient documentation

## 2017-07-27 LAB — URINALYSIS, ROUTINE W REFLEX MICROSCOPIC
Bacteria, UA: NONE SEEN
Bilirubin Urine: NEGATIVE
Glucose, UA: NEGATIVE mg/dL
Hgb urine dipstick: NEGATIVE
Ketones, ur: 20 mg/dL — AB
Leukocytes, UA: NEGATIVE
Nitrite: NEGATIVE
Protein, ur: 100 mg/dL — AB
Specific Gravity, Urine: 1.028 (ref 1.005–1.030)
pH: 5 (ref 5.0–8.0)

## 2017-07-27 NOTE — Discharge Instructions (Signed)
Preventing Injuries During Pregnancy °Trauma is the most common cause of injury and death in pregnant women. This can also result in serious harm to the baby or even death. °How can injuries affect my pregnancy? °Your baby is protected in the womb (uterus) by a sac filled with fluid (amniotic sac). Your baby can be harmed if there is a direct blow to your abdomen and pelvis. Trauma may be caused by: °· Falls. These are more common in the second and third trimester of pregnancy. °· Automobile accidents. °· Domestic violence or assault. °· Severe burns, such as from fire or electricity. ° °These injuries can result in: °· Tearing of your uterus. °· The placenta pulling away from the wall of the uterus (placental abruption). °· The amniotic sac breaking open (rupture of membranes). °· Blockage or decrease in the blood supply to your baby. °· Going into labor earlier than expected. °· Severe injuries to other parts of your body, such as your brain, spine, heart, lungs, or other organs. ° °Minor falls and low-impact automobile accidents do not usually harm your baby, even if they cause a little harm to you. °What can I do to lower my risk? °Safety °· Remove slippery rugs and loose objects on the floor. They increase your risk of tripping or slipping. °· Wear comfortable shoes that have a good grip on the sole. Do not wear high-heeled shoes. °· Always wear your seat belt properly when riding in a car. Use both the lap and shoulder belt, with the lap belt below your abdomen. Always practice safe driving. Do not ride on a motorcycle while pregnant. °Activity °· Avoid walking on wet or slippery floors. °· Do not participate in rough and violent activities or sports. °· Avoid high-risk situations and activities such as: °? Lifting heavy pots of boiling or hot liquids. °? Fixing electrical problems. °? Being near fires or starting fires. °General instructions °· Take over-the-counter and prescription medicines only as told by  your health care provider. °· Know your blood type and the father's blood type in case you develop vaginal bleeding or experience an injury for which a blood transfusion is needed. °· Spousal abuse can be a serious cause of trauma during pregnancy. If you are a victim of domestic violence or assault: °? Call your local emergency services (911 in the [place] °? Contact the National Domestic Violence Hotline for help and support. °When should I seek immediate medical care? °Get help right away if: °· You fall on your abdomen or experience any serious blow to your abdomen. °· You develop stiffness in your neck or pain after a fall or from other trauma. °· You develop a headache or vision problems after a fall or from other trauma. °· You do not feel the baby moving after a fall or trauma, or you feel that the baby is not moving as much as before the fall or trauma. °· You have been the victim of domestic violence or any other kind of physical attack. °· You have been in a car accident. °· You develop vaginal bleeding. °· You have fluid leaking from the vagina. °· You develop uterine contractions. Symptoms include pelvic cramping, pain, or serious low back pain. °· You become weak, faint, or have uncontrolled vomiting after trauma. °· You have a serious burn. This includes burns to the face, neck, hands, or genitals, or burns greater than the size of your palm anywhere else. ° °Summary °· Trauma is the most common cause of   injury and death in pregnant women and can also lead to injury or death of the baby. °· Falls, automobile accidents, domestic violence or assault, and severe burns can injure you or your baby. Make sure to get medical help right away if you experience any of these during your pregnancy. °· Take steps to prevent slips or falls in your home, such as avoiding slippery floors and removing loose rugs. °· Always wear your seat belt properly when riding in a car. Practice safe driving. °This information is  not intended to replace advice given to you by your health care provider. Make sure you discuss any questions you have with your health care provider. °Document Released: 09/26/2004 Document Revised: 08/28/2016 Document Reviewed: 08/28/2016 °Elsevier Interactive Patient Education © 2017 [employer] ° °

## 2017-07-27 NOTE — MAU Provider Note (Signed)
History   G4P1021 @ apx 8.1 wks by LMP who was taking bath last nigh and fell in tub. After that she states she had some cramping and spotting but has sice resolved but is concerned and came in to be checked. Denies any pain now and bleeding has subsided.  CSN: [id number]  Arrival date & time 07/27/17  1103   None     Chief Complaint  Patient presents with  . Fall    HPI  Past Medical History:  Diagnosis Date  . Chlamydia   . Vaginal Pap smear, abnormal     Past Surgical History:  Procedure Laterality Date  . DILATION AND CURETTAGE OF UTERUS    . NO PAST SURGERIES      Family History  Problem Relation Age of Onset  . Hypertension Mother   . Hypertension Father   . Kidney disease Maternal Grandmother     Social History   Tobacco Use  . Smoking status: Former Smoker    Packs/day: 0.50    Years: 7.00    Pack years: 3.50    Types: Cigarettes  . Smokeless tobacco: Current User  . Tobacco comment: unable to smoke for past week r/t n/v  Substance Use Topics  . Alcohol use: No    Alcohol/week: 0.0 oz    Frequency: Never  . Drug use: No    Comment: not currently    OB History    Gravida Para Term Preterm AB Living   4 1 1   1 1    SAB TAB Ectopic Multiple Live Births     1     1      Review of Systems  Constitutional: Negative.   HENT: Negative.   Eyes: Negative.   Respiratory: Negative.   Cardiovascular: Negative.   Gastrointestinal: Negative.   Endocrine: Negative.   Genitourinary: Negative.   Musculoskeletal: Negative.   Skin: Negative.   Allergic/Immunologic: Negative.   Neurological: Negative.   Hematological: Negative.   Psychiatric/Behavioral: Negative.     Allergies  Patient has no known allergies.  Home Medications    BP 132/77 (BP Location: Right Arm)   Pulse 81   Temp 98.6 F (37 C)   Resp 16   Ht 5\' 5"  (1.651 m)   Wt 187 lb (84.8 kg)   LMP 05/31/2017   SpO2 99%   BMI 31.12 kg/m   Physical Exam  Constitutional: She is  oriented to person, place, and time. She appears well-developed and well-nourished.  HENT:  Head: Normocephalic.  Eyes: Pupils are equal, round, and reactive to light.  Neck: Normal range of motion.  Cardiovascular: Normal rate, regular rhythm, normal heart sounds and intact distal pulses.  Pulmonary/Chest: Effort normal and breath sounds normal.  Abdominal: Soft. Bowel sounds are normal.  Genitourinary: Vagina normal and uterus normal.  Musculoskeletal: Normal range of motion.  Neurological: She is alert and oriented to person, place, and time. She has normal reflexes.  Skin: Skin is warm and dry.  Psychiatric: She has a normal mood and affect. Her behavior is normal. Judgment and thought content normal.    MAU Course  Procedures (including critical care time)  Labs Reviewed  URINALYSIS, ROUTINE W REFLEX MICROSCOPIC   No results found.   No diagnosis found.    MDM  VSS, us shows viable fetus at 9 wks with sm subchrionic hemorrhage. Will d/c home in stable condition

## 2017-07-27 NOTE — MAU Note (Signed)
Pt reports she fell getting out of the shower last pm, had some cramping during the night. Also reports some spotting last pm, none today.

## 2017-08-13 LAB — OB RESULTS CONSOLE GC/CHLAMYDIA: Chlamydia: NEGATIVE

## 2017-08-18 ENCOUNTER — Other Ambulatory Visit (HOSPITAL_COMMUNITY): Payer: Self-pay | Admitting: Nurse Practitioner

## 2017-08-18 ENCOUNTER — Inpatient Hospital Stay (HOSPITAL_COMMUNITY)
Admission: AD | Admit: 2017-08-18 | Discharge: 2017-08-18 | Disposition: A | Discharge status: Home / Self Care | Payer: Medicaid Other | Source: Ambulatory Visit | Attending: Obstetrics & Gynecology | Admitting: Obstetrics & Gynecology

## 2017-08-18 ENCOUNTER — Encounter (HOSPITAL_COMMUNITY): Payer: Self-pay | Admitting: *Deleted

## 2017-08-18 DIAGNOSIS — Z87891 Personal history of nicotine dependence: Secondary | ICD-10-CM | POA: Diagnosis not present

## 2017-08-18 DIAGNOSIS — O209 Hemorrhage in early pregnancy, unspecified: Secondary | ICD-10-CM | POA: Insufficient documentation

## 2017-08-18 DIAGNOSIS — Z3A12 12 weeks gestation of pregnancy: Secondary | ICD-10-CM | POA: Insufficient documentation

## 2017-08-18 DIAGNOSIS — N930 Postcoital and contact bleeding: Secondary | ICD-10-CM | POA: Insufficient documentation

## 2017-08-18 DIAGNOSIS — Z3682 Encounter for antenatal screening for nuchal translucency: Secondary | ICD-10-CM

## 2017-08-18 LAB — OB RESULTS CONSOLE RPR: RPR: NONREACTIVE

## 2017-08-18 LAB — OB RESULTS CONSOLE HEPATITIS B SURFACE ANTIGEN: HEP B S AG: NEGATIVE

## 2017-08-18 LAB — OB RESULTS CONSOLE GC/CHLAMYDIA: Gonorrhea: NEGATIVE

## 2017-08-18 LAB — OB RESULTS CONSOLE HIV ANTIBODY (ROUTINE TESTING): HIV: NONREACTIVE

## 2017-08-18 LAB — CYSTIC FIBROSIS DIAGNOSTIC STUDY: Interpretation-CFDNA:: NEGATIVE

## 2017-08-18 LAB — OB RESULTS CONSOLE ANTIBODY SCREEN: Antibody Screen: NEGATIVE

## 2017-08-18 LAB — OB RESULTS CONSOLE ABO/RH: RH TYPE: POSITIVE

## 2017-08-18 LAB — OB RESULTS CONSOLE RUBELLA ANTIBODY, IGM: Rubella: IMMUNE

## 2017-08-18 NOTE — Discharge Instructions (Signed)
Vaginal Bleeding During Pregnancy, First Trimester °A small amount of bleeding (spotting) from the vagina is common in early pregnancy. Sometimes the bleeding is normal and is not a problem, and sometimes it is a sign of something serious. Be sure to tell your doctor about any bleeding from your vagina right away. °Follow these instructions at home: °· Watch your condition for any changes. °· Follow your doctor's instructions about how active you can be. °· If you are on bed rest: °? You may need to stay in bed and only get up to use the bathroom. °? You may be allowed to do some activities. °? If you need help, make plans for someone to help you. °· Write down: °? The number of pads you use each day. °? How often you change pads. °? How soaked (saturated) your pads are. °· Do not use tampons. °· Do not douche. °· Do not have sex or orgasms until your doctor says it is okay. °· If you pass any tissue from your vagina, save the tissue so you can show it to your doctor. °· Only take medicines as told by your doctor. °· Do not take aspirin because it can make you bleed. °· Keep all follow-up visits as told by your doctor. °Contact a doctor if: °· You bleed from your vagina. °· You have cramps. °· You have labor pains. °· You have a fever that does not go away after you take medicine. °Get help right away if: °· You have very bad cramps in your back or belly (abdomen). °· You pass large clots or tissue from your vagina. °· You bleed more. °· You feel light-headed or weak. °· You pass out (faint). °· You have chills. °· You are leaking fluid or have a gush of fluid from your vagina. °· You pass out while pooping (having a bowel movement). °This information is not intended to replace advice given to you by your health care provider. Make sure you discuss any questions you have with your health care provider. °Document Released: 01/03/2014 Document Revised: 01/25/2016 Document Reviewed: 04/26/2013 °Elsevier Interactive  Patient Education © 2018 Elsevier Inc. ° °

## 2017-08-18 NOTE — MAU Note (Signed)
Urine sent to lab 

## 2017-08-18 NOTE — MAU Note (Signed)
PT  SAYS SHE HAD SEX AT 4PM-    THEN  SAW RED BLOOD   THEN  AT 8PM-   SHE  SAW BLOOD  IN CLOTHES.    PAD ON IN TRIAGE -   NOTHING  ON PAD.  HEARD   [facility]  AT  HD  TODAY .      FEELS  SOME  CRAMPS

## 2017-08-18 NOTE — MAU Provider Note (Cosign Needed)
  History     CSN: [id number]  Arrival date and time: 08/18/17 2054   None     Chief Complaint  Patient presents with  . Vaginal Bleeding   31 yo G4P1011 at 7034w6d gestation presents with vaginal bleeding after intercourse this evening. She says bleeding was bright red and like a period. Bleeding has now resolved. Denies abdominal pain or pressure.     Past Medical History:  Diagnosis Date  . Chlamydia   . Vaginal Pap smear, abnormal     Past Surgical History:  Procedure Laterality Date  . DILATION AND CURETTAGE OF UTERUS    . NO PAST SURGERIES      Family History  Problem Relation Age of Onset  . Hypertension Mother   . Hypertension Father   . Kidney disease Maternal Grandmother     Social History   Tobacco Use  . Smoking status: Former Smoker    Packs/day: 0.50    Years: 7.00    Pack years: 3.50    Types: Cigarettes  . Smokeless tobacco: Current User  . Tobacco comment: unable to smoke for past week r/t n/v  Substance Use Topics  . Alcohol use: No    Alcohol/week: 0.0 oz    Frequency: Never  . Drug use: No    Comment: not currently    Allergies: No Known Allergies  Medications Prior to Admission  Medication Sig Dispense Refill Last Dose  . promethazine (PHENERGAN) 25 MG tablet Take 1 tablet (25 mg total) every 6 (six) hours as needed by mouth for nausea or vomiting. 30 tablet 0     Review of Systems  Constitutional: Negative for activity change and appetite change.  HENT: Negative for congestion and dental problem.   Eyes: Negative for discharge and itching.  Respiratory: Negative for apnea and chest tightness.   Cardiovascular: Negative for chest pain and leg swelling.  Gastrointestinal: Negative for abdominal distention and abdominal pain.  Endocrine: Negative for cold intolerance and heat intolerance.  Genitourinary: Positive for vaginal bleeding. Negative for dysuria.  Musculoskeletal: Negative for arthralgias and back pain.  Neurological:  Negative for dizziness and light-headedness.  Hematological: Negative for adenopathy.   Physical Exam   Blood pressure 121/66, pulse 79, temperature 98.6 F (37 C), temperature source Oral, resp. rate 20, height 5\' 5"  (1.651 m), weight 188 lb 12 oz (85.6 kg), last menstrual period 05/31/2017, unknown if currently breastfeeding.  Physical Exam  Constitutional: She is oriented to person, place, and time. She appears well-developed and well-nourished.  HENT:  Head: Normocephalic and atraumatic.  Eyes: Conjunctivae are normal. Pupils are equal, round, and reactive to light.  Neck: Normal range of motion.  Cardiovascular: Normal rate and intact distal pulses.  Respiratory: Effort normal. No respiratory distress.  GI: Soft. She exhibits no distension. There is no tenderness.  Genitourinary: Vagina normal and uterus normal.  Genitourinary Comments: No vaginal bleeding  Musculoskeletal: Normal range of motion. She exhibits no edema.  Neurological: She is alert and oriented to person, place, and time.  Skin: Skin is warm and dry.  Psychiatric: She has a normal mood and affect. Her behavior is normal.    MAU Course  Procedures  MDM Bedside US showed moving fetus with FHT around 140 bpm. No signs of vaginal bleeding now.   Assessment and Plan  1. Vaginal bleeding pregnancy first trimester- reassurance. Likely 2/2 to intercourse 2. Pregnancy at 12 weeks completed gestation- follow up outpatient.  [employer] Corporationmber Arlanda Shiplett 08/18/2017, 10:23 PM

## 2017-08-21 ENCOUNTER — Encounter (HOSPITAL_COMMUNITY): Payer: Self-pay

## 2017-08-27 ENCOUNTER — Ambulatory Visit (HOSPITAL_COMMUNITY)
Admission: RE | Admit: 2017-08-27 | Discharge: 2017-08-27 | Disposition: A | Discharge status: Home / Self Care | Payer: Medicaid Other | Source: Ambulatory Visit | Attending: Nurse Practitioner | Admitting: Nurse Practitioner

## 2017-08-27 ENCOUNTER — Encounter (HOSPITAL_COMMUNITY): Payer: Self-pay

## 2017-08-27 DIAGNOSIS — Z3682 Encounter for antenatal screening for nuchal translucency: Secondary | ICD-10-CM | POA: Insufficient documentation

## 2017-08-27 DIAGNOSIS — Z3A12 12 weeks gestation of pregnancy: Secondary | ICD-10-CM

## 2017-08-29 ENCOUNTER — Other Ambulatory Visit: Payer: Self-pay

## 2017-09-02 NOTE — L&D Delivery Note (Signed)
Delivery Note At  a viable female was delivered via  (Presentation:vertex ; LOA ).  APGAR:9 , 9; weight  .   Placenta status:spont ,shultz .  Cord:3vc  with the following complications:none .  Cord pH: n/a  Anesthesia:  none Episiotomy:  none Lacerations:  none Suture Repair: n/a Est. Blood Loss 100(mL):    Mom to postpartum.  Baby to Couplet care / Skin to Skin.  Christy Holden 02/22/2018, 9:39 AM

## 2017-12-16 LAB — OB RESULTS CONSOLE HGB/HCT, BLOOD
HEMATOCRIT: 36
Hemoglobin: 12

## 2017-12-16 LAB — OB RESULTS CONSOLE RPR: RPR: NONREACTIVE

## 2017-12-16 LAB — OB RESULTS CONSOLE HIV ANTIBODY (ROUTINE TESTING): HIV: NONREACTIVE

## 2017-12-25 ENCOUNTER — Inpatient Hospital Stay (HOSPITAL_COMMUNITY): Payer: Medicaid Other

## 2017-12-25 ENCOUNTER — Encounter (HOSPITAL_COMMUNITY): Payer: Self-pay | Admitting: *Deleted

## 2017-12-25 ENCOUNTER — Inpatient Hospital Stay (HOSPITAL_COMMUNITY)
Admission: AD | Admit: 2017-12-25 | Discharge: 2017-12-25 | Disposition: A | Discharge status: Home / Self Care | Payer: Medicaid Other | Source: Ambulatory Visit | Attending: Obstetrics & Gynecology | Admitting: Obstetrics & Gynecology

## 2017-12-25 DIAGNOSIS — Z87891 Personal history of nicotine dependence: Secondary | ICD-10-CM | POA: Insufficient documentation

## 2017-12-25 DIAGNOSIS — O4693 Antepartum hemorrhage, unspecified, third trimester: Secondary | ICD-10-CM | POA: Diagnosis not present

## 2017-12-25 DIAGNOSIS — O26873 Cervical shortening, third trimester: Secondary | ICD-10-CM | POA: Insufficient documentation

## 2017-12-25 DIAGNOSIS — O98813 Other maternal infectious and parasitic diseases complicating pregnancy, third trimester: Secondary | ICD-10-CM | POA: Diagnosis not present

## 2017-12-25 DIAGNOSIS — N939 Abnormal uterine and vaginal bleeding, unspecified: Secondary | ICD-10-CM | POA: Diagnosis present

## 2017-12-25 DIAGNOSIS — Z3A29 29 weeks gestation of pregnancy: Secondary | ICD-10-CM | POA: Diagnosis not present

## 2017-12-25 DIAGNOSIS — Z3A30 30 weeks gestation of pregnancy: Secondary | ICD-10-CM | POA: Diagnosis not present

## 2017-12-25 DIAGNOSIS — B373 Candidiasis of vulva and vagina: Secondary | ICD-10-CM | POA: Diagnosis not present

## 2017-12-25 DIAGNOSIS — Z8619 Personal history of other infectious and parasitic diseases: Secondary | ICD-10-CM | POA: Insufficient documentation

## 2017-12-25 DIAGNOSIS — O98819 Other maternal infectious and parasitic diseases complicating pregnancy, unspecified trimester: Secondary | ICD-10-CM

## 2017-12-25 DIAGNOSIS — B3731 Acute candidiasis of vulva and vagina: Secondary | ICD-10-CM

## 2017-12-25 LAB — WET PREP, GENITAL
Clue Cells Wet Prep HPF POC: NONE SEEN
Sperm: NONE SEEN
Trich, Wet Prep: NONE SEEN
YEAST WET PREP: NONE SEEN

## 2017-12-25 LAB — URINALYSIS, ROUTINE W REFLEX MICROSCOPIC
Bilirubin Urine: NEGATIVE
GLUCOSE, UA: NEGATIVE mg/dL
HGB URINE DIPSTICK: NEGATIVE
KETONES UR: NEGATIVE mg/dL
Leukocytes, UA: NEGATIVE
Nitrite: NEGATIVE
PROTEIN: NEGATIVE mg/dL
Specific Gravity, Urine: 1.026 (ref 1.005–1.030)
pH: 6 (ref 5.0–8.0)

## 2017-12-25 MED ORDER — BETAMETHASONE SOD PHOS & ACET 6 (3-3) MG/ML IJ SUSP
12.0000 mg | Freq: Once | INTRAMUSCULAR | Status: AC
  Administered 2017-12-25: 12 mg via INTRAMUSCULAR
  Filled 2017-12-25: qty 2

## 2017-12-25 MED ORDER — PROGESTERONE MICRONIZED 200 MG PO CAPS: 200.0000 mg | ORAL_CAPSULE | Freq: Every day | ORAL | 2 refills | Status: DC

## 2017-12-25 MED ORDER — TERCONAZOLE 0.4 % VA CREA: 1.0000 | TOPICAL_CREAM | Freq: Every day | VAGINAL | 0 refills | Status: DC

## 2017-12-25 NOTE — MAU Note (Signed)
Pt reports she started having some vaginal bleeding and pressure last night. Dx with placenta previa. Good fetal movement reported.

## 2017-12-25 NOTE — Discharge Instructions (Signed)
Preterm Labor and Birth Information Pregnancy normally lasts 39-41 weeks. Preterm labor is when labor starts early. It starts before you have been pregnant for 37 whole weeks. What are the risk factors for preterm labor? Preterm labor is more likely to occur in women who:  Have an infection while pregnant.  Have a cervix that is short.  Have gone into preterm labor before.  Have had surgery on their cervix.  Are younger than age 30.  Are older than age 30.  Are African American.  Are pregnant with two or more babies.  Take street drugs while pregnant.  Smoke while pregnant.  Do not gain enough weight while pregnant.  Got pregnant right after another pregnancy.  What are the symptoms of preterm labor? Symptoms of preterm labor include:  Cramps. The cramps may feel like the cramps some women get during their period. The cramps may happen with watery poop (diarrhea).  Pain in the belly (abdomen).  Pain in the lower back.  Regular contractions or tightening. It may feel like your belly is getting tighter.  Pressure in the lower belly that seems to get stronger.  More fluid (discharge) leaking from the vagina. The fluid may be watery or bloody.  Water breaking.  Why is it important to notice signs of preterm labor? Babies who are born early may not be fully developed. They have a higher chance for:  Long-term heart problems.  Long-term lung problems.  Trouble controlling body systems, like breathing.  Bleeding in the brain.  A condition called cerebral palsy.  Learning difficulties.  Death.  These risks are highest for babies who are born before 34 weeks of pregnancy. How is preterm labor treated? Treatment depends on:  How long you were pregnant.  Your condition.  The health of your baby.  Treatment may involve:  Having a stitch (suture) placed in your cervix. When you give birth, your cervix opens so the baby can come out. The stitch keeps the  cervix from opening too soon.  Staying at the hospital.  Taking or getting medicines, such as: ? Hormone medicines. ? Medicines to stop contractions. ? Medicines to help the babys lungs develop. ? Medicines to prevent your baby from having cerebral palsy.  What should I do if I am in preterm labor? If you think you are going into labor too soon, call your doctor right away. How can I prevent preterm labor?  Do not use any tobacco products. ? Examples of these are cigarettes, chewing tobacco, and e-cigarettes. ? If you need help quitting, ask your doctor.  Do not use street drugs.  Do not use any medicines unless you ask your doctor if they are safe for you.  Talk with your doctor before taking any herbal supplements.  Make sure you gain enough weight.  Watch for infection. If you think you might have an infection, get it checked right away.  If you have gone into preterm labor before, tell your doctor. This information is not intended to replace advice given to you by your health care provider. Make sure you discuss any questions you have with your health care provider. Document Released: 11/15/2008 Document Revised: 01/30/2016 Document Reviewed: 01/10/2016 Elsevier Interactive Patient Education  2018 [employer]  Safe Medications in Pregnancy   Acne: Benzoyl Peroxide Salicylic Acid  Backache/Headache: Tylenol: 2 regular strength every 4 hours OR              2 Extra strength every 6 hours  Colds/Coughs/Allergies: Benadryl (  alcohol free) 25 mg every 6 hours as needed Breath right strips Claritin Cepacol throat lozenges Chloraseptic throat spray Cold-Eeze- up to three times per day Cough drops, alcohol free Flonase (by prescription only) Guaifenesin Mucinex Robitussin DM (plain only, alcohol free) Saline nasal spray/drops Sudafed (pseudoephedrine) & Actifed ** use only after [redacted] weeks gestation and if you do not have high blood pressure Tylenol Vicks  Vaporub Zinc lozenges Zyrtec   Constipation: Colace Ducolax suppositories Fleet enema Glycerin suppositories Metamucil Milk of magnesia Miralax Senokot Smooth move tea  Diarrhea: Kaopectate Imodium A-D  *NO pepto Bismol  Hemorrhoids: Anusol Anusol HC Preparation H Tucks  Indigestion: Tums Maalox Mylanta Zantac  Pepcid  Insomnia: Benadryl (alcohol free) 25mg  every 6 hours as needed Tylenol PM Unisom, no Gelcaps  Leg Cramps: Tums MagGel  Nausea/Vomiting:  Bonine Dramamine Emetrol Ginger extract Sea bands Meclizine  Nausea medication to take during pregnancy:  Unisom (doxylamine succinate 25 mg tablets) Take one tablet daily at bedtime. If symptoms are not adequately controlled, the dose can be increased to a maximum recommended dose of two tablets daily (1/2 tablet in the morning, 1/2 tablet mid-afternoon and one at bedtime). Vitamin B6 100mg  tablets. Take one tablet twice a day (up to 200 mg per day).  Skin Rashes: Aveeno products Benadryl cream or 25mg  every 6 hours as needed Calamine Lotion 1% cortisone cream  Yeast infection: Gyne-lotrimin 7 Monistat 7   **If taking multiple medications, please check labels to avoid duplicating the same active ingredients **take medication as directed on the label ** Do not exceed 4000 mg of tylenol in 24 hours **Do not take medications that contain aspirin or ibuprofen

## 2017-12-25 NOTE — MAU Provider Note (Signed)
History     CSN: [id number]  Arrival date and time: 12/25/17 [id number]   First Provider Initiated Contact with Patient 12/25/17 [phone]     Chief Complaint  Patient presents with  . Vaginal Bleeding   HPI  Christy Holden is a 32 y.o. [id number] at 2236w6d who presents with vaginal bleeding. She reports spotting when she wiped last night and spotting again around 2am. She states the bleeding was pink. She denies any pain. No recent intercourse. She reports good fetal movement. She was diagnosed with a complete previa at 20 weeks. he states she had a repeat u/s last week and the previa had not moved.    OB History    Gravida  4   Para  1   Term  1   Preterm      AB  2   Living  1     SAB      TAB  2   Ectopic      Multiple      Live Births  1           Past Medical History:  Diagnosis Date  . Chlamydia   . Vaginal Pap smear, abnormal     Past Surgical History:  Procedure Laterality Date  . DILATION AND CURETTAGE OF UTERUS    . NO PAST SURGERIES      Family History  Problem Relation Age of Onset  . Hypertension Mother   . Hypertension Father   . Kidney disease Maternal Grandmother     Social History   Tobacco Use  . Smoking status: Former Smoker    Packs/day: 0.50    Years: 7.00    Pack years: 3.50    Types: Cigarettes  . Smokeless tobacco: Current User  . Tobacco comment: unable to smoke for past week r/t n/v  Substance Use Topics  . Alcohol use: No    Alcohol/week: 0.0 oz    Frequency: Never  . Drug use: No    Types: Marijuana    Comment: not currently    Allergies: No Known Allergies  Medications Prior to Admission  Medication Sig Dispense Refill Last Dose  . promethazine (PHENERGAN) 25 MG tablet Take 1 tablet (25 mg total) every 6 (six) hours as needed by mouth for nausea or vomiting. (Patient not taking: Reported on 08/27/2017) 30 tablet 0 Not Taking    Review of Systems  Constitutional: Negative.  Negative for fatigue and fever.   HENT: Negative.   Respiratory: Negative.  Negative for shortness of breath.   Cardiovascular: Negative.  Negative for chest pain.  Gastrointestinal: Negative.  Negative for abdominal pain, constipation, diarrhea, nausea and vomiting.  Genitourinary: Positive for vaginal bleeding. Negative for dysuria and vaginal discharge.  Neurological: Negative.  Negative for dizziness and headaches.   Physical Exam   Blood pressure 127/63, pulse 70, temperature 98.5 F (36.9 C), resp. rate 18, height 5\' 4"  (1.626 m), weight 199 lb (90.3 kg), last menstrual period 05/30/2017, unknown if currently breastfeeding.  Physical Exam  Nursing note and vitals reviewed. Constitutional: She is oriented to person, place, and time. She appears well-developed and well-nourished. No distress.  HENT:  Head: Normocephalic.  Eyes: Pupils are equal, round, and reactive to light.  Cardiovascular: Normal rate, regular rhythm and normal heart sounds.  Respiratory: Effort normal and breath sounds normal. No respiratory distress.  GI: Soft. Bowel sounds are normal. She exhibits no distension. There is no tenderness.  Genitourinary:  Genitourinary Comments: No vaginal bleeding  noted. Copious thick, white adherent discharge on vaginal walls.   Neurological: She is alert and oriented to person, place, and time.  Skin: Skin is warm and dry.  Psychiatric: She has a normal mood and affect. Her behavior is normal. Judgment and thought content normal.   Fetal Tracing:  Baseline: 130 Variability: moderate Accels: 15x15 Decels: none  Toco: uterine irritability   Dilation: 1 Effacement (%): 50 Cervical Position: [place]: Ballotable Exam by:: Ma Hillock CNM   MAU Course  Procedures Results for orders placed or performed during the hospital encounter of 12/25/17 (from the past 24 hour(s))  Urinalysis, Routine w reflex microscopic     Status: Abnormal   Collection Time: 12/25/17  8:30 AM  Result Value Ref Range    Color, Urine YELLOW YELLOW   APPearance HAZY (A) CLEAR   Specific Gravity, Urine 1.026 1.005 - 1.030   pH 6.0 5.0 - 8.0   Glucose, UA NEGATIVE NEGATIVE mg/dL   Hgb urine dipstick NEGATIVE NEGATIVE   Bilirubin Urine NEGATIVE NEGATIVE   Ketones, ur NEGATIVE NEGATIVE mg/dL   Protein, ur NEGATIVE NEGATIVE mg/dL   Nitrite NEGATIVE NEGATIVE   Leukocytes, UA NEGATIVE NEGATIVE  Wet prep, genital     Status: Abnormal   Collection Time: 12/25/17  9:20 AM  Result Value Ref Range   Yeast Wet Prep HPF POC NONE SEEN NONE SEEN   Trich, Wet Prep NONE SEEN NONE SEEN   Clue Cells Wet Prep HPF POC NONE SEEN NONE SEEN   WBC, Wet Prep HPF POC MANY (A) NONE SEEN   Sperm NONE SEEN    MDM UA [place] MFM OB Limited- placenta posterior above cervical os, cervical length 1.8cm Consulted with Dr. Macon Large- will give patient BMZ today and have her come back tomorrow for second dose and repeat cervical exam. Will start prometrium per vagina.   Assessment and Plan   1. Candidiasis of vagina during pregnancy   2. Vaginal bleeding in pregnancy, third trimester   3. [redacted] weeks gestation of pregnancy   4. 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 -Patient may return to MAU as needed or if her condition were to change or worsen  Rolm Bookbinder CNM 12/25/2017, 9:10 AM   Allergies as of 12/25/2017   No Known Allergies     Medication List    TAKE these medications   prenatal multivitamin Tabs tablet Take 1 tablet by mouth daily at 12 noon.   progesterone 200 MG capsule Commonly known as:  PROMETRIUM Place 1 capsule (200 mg total) vaginally at bedtime.   promethazine 25 MG tablet Commonly known as:  PHENERGAN Take 1 tablet (25 mg total) every 6 (six) hours as needed by  mouth for nausea or vomiting.   terconazole 0.4 % vaginal cream Commonly known as:  TERAZOL 7 Place 1 applicator vaginally at bedtime.

## 2017-12-26 ENCOUNTER — Encounter (HOSPITAL_COMMUNITY): Payer: Self-pay | Admitting: *Deleted

## 2017-12-26 ENCOUNTER — Inpatient Hospital Stay (HOSPITAL_COMMUNITY)
Admission: AD | Admit: 2017-12-26 | Discharge: 2017-12-26 | Disposition: A | Discharge status: Home / Self Care | Payer: Medicaid Other | Source: Ambulatory Visit | Attending: Obstetrics & Gynecology | Admitting: Obstetrics & Gynecology

## 2017-12-26 DIAGNOSIS — Z87891 Personal history of nicotine dependence: Secondary | ICD-10-CM | POA: Insufficient documentation

## 2017-12-26 DIAGNOSIS — Z3A30 30 weeks gestation of pregnancy: Secondary | ICD-10-CM | POA: Insufficient documentation

## 2017-12-26 DIAGNOSIS — O26873 Cervical shortening, third trimester: Secondary | ICD-10-CM | POA: Diagnosis present

## 2017-12-26 DIAGNOSIS — Z09 Encounter for follow-up examination after completed treatment for conditions other than malignant neoplasm: Secondary | ICD-10-CM

## 2017-12-26 MED ORDER — BETAMETHASONE SOD PHOS & ACET 6 (3-3) MG/ML IJ SUSP
12.0000 mg | Freq: Once | INTRAMUSCULAR | Status: AC
  Administered 2017-12-26: 12 mg via INTRAMUSCULAR
  Filled 2017-12-26: qty 2

## 2017-12-26 NOTE — MAU Note (Signed)
Here for 2nd dose of betamethasone, NST and cervical exam.  No complaints.  Denies bleeding or pain, just some pressure.

## 2017-12-26 NOTE — MAU Provider Note (Signed)
History     CSN: [id number]  Arrival date and time: 12/26/17 1054   First Provider Initiated Contact with Patient 12/26/17 1140     Chief Complaint  Patient presents with  . 2nd dose of betamethasone   HPI Christy Holden is a 32 y.o. [id number] at 7534w0d who presents for her second betamethasone injection. She denies any vaginal bleeding, leaking of fluid or discharge. Denies any pain or contractions. Reports good fetal movement.   OB History    Gravida  4   Para  1   Term  1   Preterm      AB  2   Living  1     SAB      TAB  2   Ectopic      Multiple      Live Births  1           Past Medical History:  Diagnosis Date  . Chlamydia   . Vaginal Pap smear, abnormal     Past Surgical History:  Procedure Laterality Date  . DILATION AND CURETTAGE OF UTERUS    . NO PAST SURGERIES      Family History  Problem Relation Age of Onset  . Hypertension Mother   . Hypertension Father   . Kidney disease Maternal Grandmother     Social History   Tobacco Use  . Smoking status: Former Smoker    Packs/day: 0.50    Years: 7.00    Pack years: 3.50    Types: Cigarettes  . Smokeless tobacco: Current User  . Tobacco comment: unable to smoke for past week r/t n/v  Substance Use Topics  . Alcohol use: No    Alcohol/week: 0.0 oz    Frequency: Never  . Drug use: No    Types: Marijuana    Comment: not currently    Allergies: No Known Allergies  Medications Prior to Admission  Medication Sig Dispense Refill Last Dose  . Prenatal Vit-Fe Fumarate-FA (PRENATAL MULTIVITAMIN) TABS tablet Take 1 tablet by mouth daily at 12 noon.   12/24/2017 at Unknown time  . progesterone (PROMETRIUM) 200 MG capsule Place 1 capsule (200 mg total) vaginally at bedtime. 30 capsule 2   . promethazine (PHENERGAN) 25 MG tablet Take 1 tablet (25 mg total) every 6 (six) hours as needed by mouth for nausea or vomiting. (Patient not taking: Reported on 08/27/2017) 30 tablet 0 Not Taking at  Unknown time  . terconazole (TERAZOL 7) 0.4 % vaginal cream Place 1 applicator vaginally at bedtime. 45 g 0     Review of Systems  Constitutional: Negative.  Negative for fatigue and fever.  HENT: Negative.   Respiratory: Negative.  Negative for shortness of breath.   Cardiovascular: Negative.  Negative for chest pain.  Gastrointestinal: Negative.  Negative for abdominal pain, constipation, diarrhea, nausea and vomiting.  Genitourinary: Negative.  Negative for dysuria.  Neurological: Negative.  Negative for dizziness and headaches.   Physical Exam   Blood pressure 127/76, pulse 89, temperature 99 F (37.2 C), temperature source Oral, resp. rate 18, last menstrual period 05/30/2017, unknown if currently breastfeeding.  Physical Exam  Nursing note and vitals reviewed. Constitutional: She is oriented to person, place, and time. She appears well-developed and well-nourished. No distress.  HENT:  Head: Normocephalic.  Eyes: Pupils are equal, round, and reactive to light.  Cardiovascular: Normal rate, regular rhythm and normal heart sounds.  Respiratory: Effort normal and breath sounds normal. No respiratory distress.  GI: Soft. Bowel  sounds are normal. She exhibits no distension. There is no tenderness.  Neurological: She is alert and oriented to person, place, and time.  Skin: Skin is warm and dry.  Psychiatric: She has a normal mood and affect. Her behavior is normal. Judgment and thought content normal.   Dilation: 1 Effacement (%): 50 Exam by:: C Kenneith Stief CNM  Fetal Tracing:  Baseline: 120 Variability: moderate Accels: 15x15 Decels: none  Toco: none  MAU Course  Procedures  MDM BMZ No change in cervical exam  Assessment and Plan   1. Follow up   2. 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 [redacted] weeks gestation of pregnancy    -Discharge home in stable condition -Preterm labor precautions discussed -Patient advised to follow-up with [facility] as  scheduled on 5/2 for prenatal care -Patient may return to MAU as needed or if her condition were to change or worsen  Rolm Bookbinder CNM 12/26/2017, 11:40 AM

## 2017-12-26 NOTE — Discharge Instructions (Signed)

## 2018-01-01 ENCOUNTER — Other Ambulatory Visit: Payer: Self-pay

## 2018-01-01 ENCOUNTER — Ambulatory Visit (INDEPENDENT_AMBULATORY_CARE_PROVIDER_SITE_OTHER): Payer: Medicaid Other | Admitting: Obstetrics & Gynecology

## 2018-01-01 ENCOUNTER — Encounter: Payer: Self-pay | Admitting: *Deleted

## 2018-01-01 ENCOUNTER — Encounter: Payer: Self-pay | Admitting: General Practice

## 2018-01-01 ENCOUNTER — Encounter: Payer: Self-pay | Admitting: Obstetrics & Gynecology

## 2018-01-01 VITALS — BP 142/74 | HR 83 | Wt 201.2 lb

## 2018-01-01 DIAGNOSIS — R87810 Cervical high risk human papillomavirus (HPV) DNA test positive: Secondary | ICD-10-CM | POA: Diagnosis not present

## 2018-01-01 DIAGNOSIS — Z029 Encounter for administrative examinations, unspecified: Secondary | ICD-10-CM

## 2018-01-01 DIAGNOSIS — O4403 Placenta previa specified as without hemorrhage, third trimester: Secondary | ICD-10-CM

## 2018-01-01 DIAGNOSIS — O26873 Cervical shortening, third trimester: Secondary | ICD-10-CM

## 2018-01-01 DIAGNOSIS — R87610 Atypical squamous cells of undetermined significance on cytologic smear of cervix (ASC-US): Secondary | ICD-10-CM

## 2018-01-01 DIAGNOSIS — O99810 Abnormal glucose complicating pregnancy: Secondary | ICD-10-CM | POA: Diagnosis not present

## 2018-01-01 DIAGNOSIS — O4400 Placenta previa specified as without hemorrhage, unspecified trimester: Secondary | ICD-10-CM | POA: Insufficient documentation

## 2018-01-01 DIAGNOSIS — Z302 Encounter for sterilization: Secondary | ICD-10-CM | POA: Diagnosis not present

## 2018-01-01 DIAGNOSIS — O0990 Supervision of high risk pregnancy, unspecified, unspecified trimester: Secondary | ICD-10-CM

## 2018-01-01 DIAGNOSIS — O0993 Supervision of high risk pregnancy, unspecified, third trimester: Secondary | ICD-10-CM

## 2018-01-01 MED ORDER — PROGESTERONE MICRONIZED 200 MG PO CAPS: 200.0000 mg | ORAL_CAPSULE | Freq: Every day | ORAL | 2 refills | Status: DC

## 2018-01-01 NOTE — Progress Notes (Signed)
PRENATAL VISIT NOTE  Subjective:  Christy Holden is a 32 y.o. [phone] at [redacted]w[redacted]d being seen today for transfer of prenatal care from [facility] for placenta previa; which has resolved on recent scan.  No bleeding episodes.  Recent scan also showed incidental 1.8 cm cervical length, no contractions. Evaluated in MAU, given betamethasone regimen and had unchanged cervical exam of 1/50/high on 4/25 and 4/26.   Patient currently reports no complaints.  Contractions: Not present. Vag. Bleeding: None.  Movement: Present. Denies leaking of fluid.   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  The following portions of the patient's history were reviewed and updated as appropriate: allergies, current medications, past family history, past medical history, past social history, past surgical history and problem list. Problem list updated.  Objective:   Vitals:   01/01/18 1338  BP: (!) 142/74  Pulse: 83  Weight: 201 lb 3.2 oz (91.3 kg)    Fetal Status: Fetal Heart Rate (bpm): 128   Movement: Present     General:  Alert, oriented and cooperative. Patient is in no acute distress.  Skin: Skin is warm and dry. No rash noted.   Cardiovascular: Normal heart rate noted  Respiratory: Normal respiratory effort, no problems with respiration noted  Abdomen: Soft, gravid, appropriate for gestational age.  Pain/Pressure: Present     Pelvic: Cervical exam performed Dilation: 1 Effacement (%): 50 Station: Ballotable  Extremities: Normal range of motion.  Edema: None  Mental Status: Normal mood and affect. Normal behavior. Normal judgment and thought content.   Imaging: [place] Mfm Ob  Transvaginal  Result Date: 12/26/2017 ----------------------------------------------------------------------  OBSTETRICS REPORT                      (Signed Final 12/26/2017 07:44 am) ---------------------------------------------------------------------- Patient Info  ID #:       [id number]                          D.O.B.:  17-Dec-1985 (31 yrs)  Name:       Christy Holden                 Visit Date: 12/25/2017 10:01 am ---------------------------------------------------------------------- Performed By  Performed By:     Earley Brooke     Ref. Address:     Faculty                    BS, RDMS  Attending:        Particia Nearing MD       Secondary Phy.:   MAU Nursing-                                                             MAU/Triage  Referred By:      Elisha Headland             Location:         [facility]  NEILL CNM ---------------------------------------------------------------------- Orders   #  Description                                 Code   1  [place] MFM OB LIMITED                           [id number]   2  [place] MFM OB TRANSVAGINAL                      [id number]  ----------------------------------------------------------------------   #  Ordered By               Order #        Accession #    Episode #   1  CAROLINE NEILL           [id number]      [id number]     [id number]   2  CAROLINE NEILL           [id number]      [id number]     [id number]  ---------------------------------------------------------------------- Indications   [redacted] weeks gestation of pregnancy                Z3A.29   Vaginal bleeding in pregnancy, third trimester O46.93  ---------------------------------------------------------------------- OB History  Blood Type:            Height:  5'5"   Weight (lb):  188       BMI:  31.28  Gravidity:    3         Term:   1  TOP:          1        Living:  1 ---------------------------------------------------------------------- Fetal Evaluation  Num Of Fetuses:     1  Fetal Heart         131   Rate(bpm):  Cardiac Activity:   Observed  Presentation:       Cephalic  Placenta:           Posterior, above cervical os  Amniotic Fluid  AFI FV:      Subjectively within normal limits  AFI Sum(cm)     %Tile       Largest Pocket(cm)  17.75           67          5.95  RUQ(cm)       RLQ(cm)       LUQ(cm)        LLQ(cm)  5.95          4.04          3.85           3.91 ---------------------------------------------------------------------- Gestational Age  LMP:           29w 6d        Date:  05/30/17                 EDD:   03/06/18  Best:          29w 6d     Det. By:  LMP  (05/30/17)          EDD:   03/06/18 ---------------------------------------------------------------------- Cervix Uterus Adnexa  Cervix  Length:            1.8  cm.  Measured transvaginally. ---------------------------------------------------------------------- Impression  SIUP at 29+6 weeks with cardiac activity  Cephalic presentation  Normal amniotic fluid volume  Posterior/right lateral placenta; no previa; no subchorionic  fluid collections/hemorrhage identified; inferior edge of  posterior/right lateral placenta ended  2.0 cms from internal  os  EV views of cervix: funneling with distal closed portion  measuring 1.8 cms ---------------------------------------------------------------------- Recommendations  Follow-up as clinically indicated ----------------------------------------------------------------------                 Particia Nearing, MD Electronically Signed Final Report   12/26/2017 07:44 am ----------------------------------------------------------------------  [place] Mfm Ob Limited  Result Date: 12/26/2017 ----------------------------------------------------------------------  OBSTETRICS REPORT                      (Signed Final 12/26/2017 07:44 am) ---------------------------------------------------------------------- Patient Info  ID #:       [id number]                          D.O.B.:  [birth year] (31 yrs)  Name:       Christy Holden                  Visit Date: 12/25/2017 10:01 am ---------------------------------------------------------------------- Performed By  Performed By:     Earley Brooke     Ref. Address:     Faculty                    BS, RDMS  Attending:        Particia Nearing MD       Secondary Phy.:   MAU Nursing-                                                             MAU/Triage  Referred By:      Elisha Headland             Location:         [facility]                    NEILL CNM ---------------------------------------------------------------------- Orders   #  Description                                 Code   1  [place] MFM OB LIMITED                           76815.01   2  [place] MFM OB TRANSVAGINAL                      40981.1  ----------------------------------------------------------------------   #  Ordered By               Order #        Accession #    Episode #   1  CAROLINE NEILL           [id number]      [id number]     [id number]   2  CAROLINE NEILL           [id number]      [id number]     [id number]  ---------------------------------------------------------------------- Indications   [redacted] weeks gestation of pregnancy  Z3A.29   Vaginal bleeding in pregnancy, third trimester O46.93  ---------------------------------------------------------------------- OB History  Blood Type:            Height:  5'5"   Weight (lb):  188       BMI:  31.28  Gravidity:    3         Term:   1  TOP:          1        Living:  1 ---------------------------------------------------------------------- Fetal Evaluation  Num Of Fetuses:     1  Fetal Heart         131  Rate(bpm):  Cardiac Activity:   Observed  Presentation:       Cephalic  Placenta:           Posterior, above cervical os  Amniotic Fluid  AFI FV:      Subjectively within normal limits  AFI Sum(cm)     %Tile       Largest Pocket(cm)  17.75           67          5.95  RUQ(cm)       RLQ(cm)       LUQ(cm)        LLQ(cm)  5.95          4.04          3.85           3.91  ---------------------------------------------------------------------- Gestational Age  LMP:           29w 6d        Date:  05/30/17                 EDD:   03/06/18  Best:          29w 6d     Det. By:  LMP  (05/30/17)          EDD:   03/06/18 ---------------------------------------------------------------------- Cervix Uterus Adnexa  Cervix  Length:            1.8  cm.  Measured transvaginally. ---------------------------------------------------------------------- Impression  SIUP at 29+6 weeks with cardiac activity  Cephalic presentation  Normal amniotic fluid volume  Posterior/right lateral placenta; no previa; no subchorionic  fluid collections/hemorrhage identified; inferior edge of  posterior/right lateral placenta ended  2.0 cms from internal  os  EV views of cervix: funneling with distal closed portion  measuring 1.8 cms ---------------------------------------------------------------------- Recommendations  Follow-up as clinically indicated ----------------------------------------------------------------------                 Particia Nearing, MD Electronically Signed Final Report   12/26/2017 07:44 am ----------------------------------------------------------------------   Assessment and Plan:  Pregnancy: [id number] at [redacted]w[redacted]d  1. Placenta previa in third trimester Resolved on last week's scan  2. Short cervix during pregnancy in third trimester Incidental finding on last scan.  1.8 cm length. Asymptomatic, unchanged exam. Continue Prometrium. PTL precautions advised.  - progesterone (PROMETRIUM) 200 MG capsule; Place 1 capsule (200 mg total) vaginally at bedtime.  Dispense: 30 capsule; Refill: 2  3. Abnormal glucose tolerance test (GTT) during pregnancy, antepartum 1 hr GTT 138, will return for diagnostic 2 hr GTT soon.  4. 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 Request for sterilization Counseled extensively about permanence of  methods, offered LARCS but she does not want any IUDs, implants or hormones. FOB does not want vasectomy. Papers signed today,  information given to her to review at home  6. Supervision of high risk pregnancy, antepartum Preterm labor symptoms and general obstetric precautions including but not limited to vaginal bleeding, contractions, leaking of fluid and fetal movement were reviewed in detail with the patient. The nature of [facility] with multiple MDs and other Advanced Practice Providers was explained to patient; also emphasized that residents, students are part of our team.  Please refer to After Visit Summary for other counseling recommendations.  Return in about 1 week (around 01/08/2018) for 2 hr GTT, SMA, OB Visit (HOB) .   Jaynie Collins, MD

## 2018-01-01 NOTE — Patient Instructions (Addendum)
Return to clinic for any scheduled appointments or obstetric concerns, or go to MAU for evaluation    Postpartum Tubal Ligation Postpartum tubal ligation (PPTL) is a procedure to close the fallopian tubes. This is done so that you cannot get pregnant. When the fallopian tubes are closed, the eggs that the ovaries release cannot enter the uterus, and sperm cannot reach the eggs. PPTL is done right after childbirth or 1-2 days after childbirth, before the uterus returns to its normal location. PPTL is sometimes called "getting your tubes tied." You should not have this procedure if you want to get pregnant someday or if you are unsure about having more children. Tell a health care provider about:  Any allergies you have.  All medicines you are taking, including vitamins, herbs, eye drops, creams, and over-the-counter medicines.  Previous problems you or members of your family have had with the use of anesthetics.  Any blood disorders you have.  Previous surgeries you have had.  Any medical conditions you may have.  Any past pregnancies. What are the risks? Generally, this is a safe procedure. However, problems may occur, including:  Infection.  Bleeding.  Injury to surrounding organs.  Side effects from anesthetics.  Failure of the procedure.  This procedure can increase your risk of a kind of pregnancy in which a fertilized egg attaches to the outside of the uterus (ectopic pregnancy). What happens before the procedure?  Ask your health care provider about: ? How much pain you can expect to have. ? What medicines you will be given for pain, especially if you are planning to breastfeed.  Follow instructions from your health care provider about eating and drinking restrictions. What happens during the procedure? If you had a vaginal delivery:  You may be given one or more of the following: ? A medicine that helps you relax (sedative). ? A medicine to numb the area (local  anesthetic). ? A medicine to make you fall asleep (general anesthetic). ? A medicine that is injected into an area of your body to numb everything below the injection site (regional anesthetic).  If you have been given a general anesthetic, a tube will be put down your throat to help you breathe.  An IV tube will be inserted into one of your veins to give you medicines and fluids during the procedure.  Your bladder may be emptied with a small tube (catheter).  An incision will be made just below your belly button.  Your fallopian tubes will be located and brought up through the incision.  Your fallopian tubes will be tied off, burned (cauterized), or blocked with a clip, ring, or clamp. A small portion in the center of each fallopian tube may be removed.  The incision will be closed with stitches (sutures).  A bandage (dressing) will be placed over the incision.  If you had a cesarean delivery:  Tubal ligation will be done through the incision that was used for the cesarean delivery of your baby.  The incision will be closed with sutures.  A dressing will be placed over the incision.  The procedure may vary among health care providers and hospitals. What happens after the procedure?  Your blood pressure, heart rate, breathing rate, and blood oxygen level will be monitored often until the medicines you were given have worn off.  You will be given pain medicine as needed.  Do not drive for 24 hours if you received a sedative. This information is not intended to replace advice  given to you by your health care provider. Make sure you discuss any questions you have with your health care provider. Document Released: 08/19/2005 Document Revised: 01/22/2016 Document Reviewed: 07/30/2015 Elsevier Interactive Patient Education  [employer].

## 2018-01-02 ENCOUNTER — Encounter: Payer: Self-pay | Admitting: *Deleted

## 2018-01-05 ENCOUNTER — Encounter: Payer: Self-pay | Admitting: *Deleted

## 2018-01-05 LAB — GLUCOSE, 1 HOUR
Glucose Tolerance, 1 hour: 114
Glucose Tolerance, 1 hour: 138

## 2018-01-05 LAB — DRUG SCREEN, URINE

## 2018-01-05 LAB — ALPHA FETOPROTEIN, MATERNAL: AFP, SERUM MAT SCREEN: NEGATIVE

## 2018-01-05 LAB — AFP, QUAD SCREEN: AFP QUAD INTERP: NEGATIVE

## 2018-01-05 LAB — CYTOLOGY - PAP

## 2018-01-05 LAB — CULTURE, OB URINE: Urine Culture, OB: NEGATIVE

## 2018-01-06 ENCOUNTER — Other Ambulatory Visit: Payer: Self-pay

## 2018-01-06 ENCOUNTER — Ambulatory Visit (HOSPITAL_COMMUNITY)
Admission: EM | Admit: 2018-01-06 | Discharge: 2018-01-06 | Disposition: A | Discharge status: Home / Self Care | Payer: Medicaid Other | Attending: Family Medicine | Admitting: Family Medicine

## 2018-01-06 ENCOUNTER — Encounter (HOSPITAL_COMMUNITY): Payer: Self-pay | Admitting: Emergency Medicine

## 2018-01-06 DIAGNOSIS — L02412 Cutaneous abscess of left axilla: Secondary | ICD-10-CM | POA: Diagnosis not present

## 2018-01-06 MED ORDER — CEPHALEXIN 500 MG PO CAPS: 500.0000 mg | ORAL_CAPSULE | Freq: Four times a day (QID) | ORAL | 0 refills | Status: AC

## 2018-01-06 NOTE — Discharge Instructions (Signed)
Please begin Keflex every 6 hours for the next week.  Apply warm compresses to armpit and also do warm soaks in the bathtub with massage.  Please return if symptoms not improving or symptoms worsening.

## 2018-01-06 NOTE — ED Triage Notes (Signed)
Pt has an abscess in her left axillary.  She states these are recurrent and usually go away but this one is getting bigger.  She has a history of having I&D's done on these in the past.

## 2018-01-06 NOTE — ED Provider Notes (Signed)
[facility]    CSN: [id number] Arrival date & time: 01/06/18  1515     History   Chief Complaint Chief Complaint  Patient presents with  . Abscess    HPI Christy Holden is a 32 y.o. female approximately [redacted] weeks pregnant presenting today for evaluation of abscesses in her left axilla.  Patient states that she has had recurrent abscesses worse in her left axilla for a while, typically treats at home and does not require I&D.  Symptoms have been worsening over the past 3 days.  Has been trying warm compresses without relief.  Denies any drainage.  Has never seen surgery for this issue previously.  HPI  Past Medical History:  Diagnosis Date  . Chlamydia   . Migraine headache 07/11/2013  . Vaginal Pap smear, abnormal     Patient Active Problem List   Diagnosis Date Noted  . Supervision of high risk pregnancy, antepartum 01/01/2018  . Short cervix during pregnancy in third trimester 01/01/2018  . 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 Abnormal glucose tolerance test (GTT) during pregnancy, antepartum 01/01/2018  . Request for sterilization 01/01/2018  . Obesity, unspecified 06/06/2013    Past Surgical History:  Procedure Laterality Date  . DILATION AND CURETTAGE OF UTERUS    . NO PAST SURGERIES      OB History    Gravida  4   Para  1   Term  1   Preterm      AB  2   Living  1     SAB      TAB  2   Ectopic      Multiple      Live Births  1            Home Medications    Prior to Admission medications   Medication Sig Start Date End Date Taking? Authorizing Provider  Prenatal Vit-Fe Fumarate-FA (PRENATAL MULTIVITAMIN) TABS tablet Take 1 tablet by mouth daily at 12 noon.   Yes [provider]  progesterone (PROMETRIUM) 200 MG capsule Place 1 capsule (200 mg total) vaginally at bedtime. 01/01/18  Yes Anyanwu, Jethro Bastos, MD  cephALEXin (KEFLEX) 500 MG capsule Take 1 capsule (500 mg total) by mouth 4  (four) times daily for 7 days. 01/06/18 01/13/18  Savian Mazon, Junius Creamer, PA-C    Family History Family History  Problem Relation Age of Onset  . Hypertension Mother   . Hypertension Father   . Kidney disease Maternal Grandmother     Social History Social History   Tobacco Use  . Smoking status: Former Smoker    Packs/day: 0.50    Years: 7.00    Pack years: 3.50    Types: Cigarettes  . Smokeless tobacco: Never Used  . Tobacco comment: unable to smoke for past week r/t n/v  Substance Use Topics  . Alcohol use: No    Alcohol/week: 0.0 oz    Frequency: Never  . Drug use: No    Types: Marijuana    Comment: not currently     Allergies   Patient has no known allergies.   Review of Systems Review of Systems  Constitutional: Negative for fatigue and fever.  Respiratory: Negative for cough and shortness of breath.   Cardiovascular: Negative for chest pain.  Gastrointestinal: Negative for abdominal pain, nausea and vomiting.  Musculoskeletal: Negative for neck pain.  Skin: Negative for color change, rash and wound.  Abscess  Neurological: Negative for dizziness, light-headedness and headaches.     Physical Exam Triage Vital Signs ED Triage Vitals  Enc Vitals Group     BP 01/06/18 1552 124/75     Pulse Rate 01/06/18 1552 79     Resp --      Temp 01/06/18 1552 98.3 F (36.8 C)     Temp Source 01/06/18 1552 Oral     SpO2 01/06/18 1552 100 %     Weight --      Height --      Head Circumference --      Peak Flow --      Pain Score 01/06/18 1551 10     Pain Loc --      Pain Edu? --      Excl. in GC? --    No data found.  Updated Vital Signs BP 124/75 (BP Location: Right Arm)   Pulse 79   Temp 98.3 F (36.8 C) (Oral)   LMP 05/30/2017   SpO2 100%   Visual Acuity Right Eye Distance:   Left Eye Distance:   Bilateral Distance:    Right Eye Near:   Left Eye Near:    Bilateral Near:     Physical Exam  Constitutional: She appears well-developed and  well-nourished. No distress.  HENT:  Head: Normocephalic and atraumatic.  Eyes: Conjunctivae are normal.  Neck: Neck supple.  Cardiovascular: Normal rate.  No murmur heard. Pulmonary/Chest: Effort normal. No respiratory distress.  Musculoskeletal: She exhibits no edema.  Neurological: She is alert.  Skin: Skin is warm and dry.  Multiple abscesses to left axilla, varying areas of induration and fluctuance, significantly tender to palpation  Psychiatric: She has a normal mood and affect.  Nursing note and vitals reviewed.    UC Treatments / Results  Labs (all labs ordered are listed, but only abnormal results are displayed) Labs Reviewed - No data to display  EKG None  Radiology No results found.  Procedures Incision and Drainage Date/Time: 01/06/2018 5:26 PM Performed by: Deklyn Trachtenberg, Junius Creamer, PA-C Authorized by: Mardella Layman, MD   Consent:    Consent obtained:  Verbal   Consent given by:  Patient   Risks discussed:  Bleeding, incomplete drainage and pain   Alternatives discussed:  No treatment Location:    Type:  Abscess   Size:  3   Location:  Upper extremity   Upper extremity location:  Shoulder   Shoulder location:  L shoulder Pre-procedure details:    Skin preparation:  Betadine Anesthesia (see MAR for exact dosages):    Anesthesia method:  Local infiltration   Local anesthetic:  Lidocaine 2% WITH epi Procedure type:    Complexity:  Simple Procedure details:    Needle aspiration: no     Incision types:  Single straight   Incision depth:  Subcutaneous   Scalpel blade:  11   Drainage:  Bloody and purulent   Drainage amount:  Scant   Wound treatment:  Wound left open   Packing materials:  None Post-procedure details:    Patient tolerance of procedure:  Tolerated well, no immediate complications   (including critical care time)  Medications Ordered in UC Medications - No data to display  Initial Impression / Assessment and Plan / UC Course  I have  reviewed the triage vital signs and the nursing notes.  Pertinent labs & imaging results that were available during my care of the patient were reviewed by me and considered in my medical  decision making (see chart for details).     Minimal pustular drainage obtained, will provide Keflex for further management, warm compresses.  Follow-up if symptoms not improving or worsening. Discussed strict return precautions. Patient verbalized understanding and is agreeable with plan.  Final Clinical Impressions(s) / UC Diagnoses   Final diagnoses:  Abscess of left axilla     Discharge Instructions     Please begin Keflex every 6 hours for the next week.  Apply warm compresses to armpit and also do warm soaks in the bathtub with massage.  Please return if symptoms not improving or symptoms worsening.   ED Prescriptions    Medication Sig Dispense Auth. Provider   cephALEXin (KEFLEX) 500 MG capsule Take 1 capsule (500 mg total) by mouth 4 (four) times daily for 7 days. 28 capsule Brinden Kincheloe C, PA-C     Controlled Substance Prescriptions [place] Controlled Substance Registry consulted? Not Applicable   Lew Dawes, [place] 01/06/18 1728

## 2018-01-12 ENCOUNTER — Other Ambulatory Visit: Payer: Self-pay | Admitting: *Deleted

## 2018-01-12 ENCOUNTER — Telehealth: Payer: Self-pay | Admitting: *Deleted

## 2018-01-12 DIAGNOSIS — O0990 Supervision of high risk pregnancy, unspecified, unspecified trimester: Secondary | ICD-10-CM

## 2018-01-12 NOTE — Telephone Encounter (Signed)
Pt left message on 5/9 stating that she left papers for her job and wants to know if they were completed and faxed. Her job keeps asking her about them.

## 2018-01-13 ENCOUNTER — Ambulatory Visit (INDEPENDENT_AMBULATORY_CARE_PROVIDER_SITE_OTHER): Payer: Medicaid Other | Admitting: Obstetrics and Gynecology

## 2018-01-13 ENCOUNTER — Other Ambulatory Visit: Payer: Medicaid Other

## 2018-01-13 VITALS — BP 122/80 | HR 72 | Wt 200.8 lb

## 2018-01-13 DIAGNOSIS — Z302 Encounter for sterilization: Secondary | ICD-10-CM

## 2018-01-13 DIAGNOSIS — O0990 Supervision of high risk pregnancy, unspecified, unspecified trimester: Secondary | ICD-10-CM

## 2018-01-13 DIAGNOSIS — O26873 Cervical shortening, third trimester: Secondary | ICD-10-CM

## 2018-01-13 DIAGNOSIS — O0993 Supervision of high risk pregnancy, unspecified, third trimester: Secondary | ICD-10-CM

## 2018-01-13 NOTE — Progress Notes (Signed)
Subjective:  Christy Holden is a 32 y.o. [phone] at [redacted]w[redacted]d being seen today for ongoing prenatal care.  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  Patient reports no complaints.  Contractions: Not present. Vag. Bleeding: None.  Movement: Present. Denies leaking of fluid.   The following portions of the patient's history were reviewed and updated as appropriate: allergies, current medications, past family history, past medical history, past social history, past surgical history and problem list. Problem list updated.  Objective:   Vitals:   01/13/18 0837  BP: 122/80  Pulse: 72  Weight: 91.1 kg (200 lb 12.8 oz)    Fetal Status: Fetal Heart Rate (bpm): 150 Fundal Height: 31 cm Movement: Present     General:  Alert, oriented and cooperative. Patient is in no acute distress.  Skin: Skin is warm and dry. No rash noted.   Cardiovascular: Normal heart rate noted  Respiratory: Normal respiratory effort, no problems with respiration noted  Abdomen: Soft, gravid, appropriate for gestational age. Pain/Pressure: Present     Pelvic: Vag. Bleeding: None     Cervical exam deferred        Extremities: Normal range of motion.  Edema: None  Mental Status: Normal mood and affect. Normal behavior. Normal judgment and thought content.   Urinalysis:      Assessment and Plan:  Pregnancy: G4P1021 at [redacted]w[redacted]d  1. Supervision of high risk pregnancy, antepartum Doing well. Getting 28wk labs today. Failed 1hr GTT so receiving 2hr GTT today.   2. Short cervix during pregnancy in third trimester Taking Prometrium daily. Denies any contractions. S/p BMZ on 4/25-4/26.   3.  Request for sterilization Desires BTL. Consent signed and in chart.   Preterm labor symptoms and general obstetric precautions including but not limited to vaginal bleeding, contractions, leaking of fluid and fetal movement were reviewed in detail with the patient. Please refer to After Visit Summary for other counseling recommendations.  Return in about 2 weeks (around 01/27/2018) for ob visit.   Pincus Large, DO

## 2018-01-13 NOTE — Patient Instructions (Signed)
Braxton Hicks Contractions °Contractions of the uterus can occur throughout pregnancy, but they are not always a sign that you are in labor. You may have practice contractions called Braxton Hicks contractions. These false labor contractions are sometimes confused with true labor. °What are Braxton Hicks contractions? °Braxton Hicks contractions are tightening movements that occur in the muscles of the uterus before labor. Unlike true labor contractions, these contractions do not result in opening (dilation) and thinning of the cervix. Toward the end of pregnancy (32-34 weeks), Braxton Hicks contractions can happen more often and may become stronger. These contractions are sometimes difficult to tell apart from true labor because they can be very uncomfortable. You should not feel embarrassed if you go to the hospital with false labor. °Sometimes, the only way to tell if you are in true labor is for your health care provider to look for changes in the cervix. The health care provider will do a physical exam and may monitor your contractions. If you are not in true labor, the exam should show that your cervix is not dilating and your water has not broken. °If there are other health problems associated with your pregnancy, it is completely safe for you to be sent home with false labor. You may continue to have Braxton Hicks contractions until you go into true labor. °How to tell the difference between true labor and false labor °True labor °· Contractions last 30-70 seconds. °· Contractions become very regular. °· Discomfort is usually felt in the top of the uterus, and it spreads to the lower abdomen and low back. °· Contractions do not go away with walking. °· Contractions usually become more intense and increase in frequency. °· The cervix dilates and gets thinner. °False labor °· Contractions are usually shorter and not as strong as true labor contractions. °· Contractions are usually irregular. °· Contractions  are often felt in the front of the lower abdomen and in the groin. °· Contractions may go away when you walk around or change positions while lying down. °· Contractions get weaker and are shorter-lasting as time goes on. °· The cervix usually does not dilate or become thin. °Follow these instructions at home: °· Take over-the-counter and prescription medicines only as told by your health care provider. °· Keep up with your usual exercises and follow other instructions from your health care provider. °· Eat and drink lightly if you think you are going into labor. °· If Braxton Hicks contractions are making you uncomfortable: °? Change your position from lying down or resting to walking, or change from walking to resting. °? Sit and rest in a tub of warm water. °? Drink enough fluid to keep your urine pale yellow. Dehydration may cause these contractions. °? Do slow and deep breathing several times an hour. °· Keep all follow-up prenatal visits as told by your health care provider. This is important. °Contact a health care provider if: °· You have a fever. °· You have continuous pain in your abdomen. °Get help right away if: °· Your contractions become stronger, more regular, and closer together. °· You have fluid leaking or gushing from your vagina. °· You pass blood-tinged mucus (bloody show). °· You have bleeding from your vagina. °· You have low back pain that you never had before. °· You feel your baby’s head pushing down and causing pelvic pressure. °· Your baby is not moving inside you as much as it used to. °Summary °· Contractions that occur before labor are called Braxton   Hicks contractions, false labor, or practice contractions. °· Braxton Hicks contractions are usually shorter, weaker, farther apart, and less regular than true labor contractions. True labor contractions usually become progressively stronger and regular and they become more frequent. °· Manage discomfort from Braxton Hicks contractions by  changing position, resting in a warm bath, drinking plenty of water, or practicing deep breathing. °This information is not intended to replace advice given to you by your health care provider. Make sure you discuss any questions you have with your health care provider. °Document Released: 01/02/2017 Document Revised: 01/02/2017 Document Reviewed: 01/02/2017 °Elsevier Interactive Patient Education © 2018 Elsevier Inc. ° °

## 2018-01-14 LAB — CBC
Hematocrit: 34 % (ref 34.0–46.6)
Hemoglobin: 11.2 g/dL (ref 11.1–15.9)
MCH: 30.4 pg (ref 26.6–33.0)
MCHC: 32.9 g/dL (ref 31.5–35.7)
MCV: 92 fL (ref 79–97)
PLATELETS: 288 10*3/uL (ref 150–379)
RBC: 3.68 x10E6/uL — ABNORMAL LOW (ref 3.77–5.28)
RDW: 13.6 % (ref 12.3–15.4)
WBC: 8.1 10*3/uL (ref 3.4–10.8)

## 2018-01-14 LAB — GLUCOSE TOLERANCE, 2 HOURS W/ 1HR
Glucose, 1 hour: 109 mg/dL (ref 65–179)
Glucose, 2 hour: 89 mg/dL (ref 65–152)
Glucose, Fasting: 75 mg/dL (ref 65–91)

## 2018-01-14 LAB — HIV ANTIBODY (ROUTINE TESTING W REFLEX): HIV SCREEN 4TH GENERATION: NONREACTIVE

## 2018-01-14 LAB — RPR: RPR Ser Ql: NONREACTIVE

## 2018-01-19 LAB — SMN1 COPY NUMBER ANALYSIS (SMA CARRIER SCREENING)

## 2018-01-23 NOTE — Telephone Encounter (Signed)
Called pt and heard message stating that pt has voicemail which has not been set up yet. Unable to leave message. Per chart review, papers were completed on 5/16 - scanned under media tab.

## 2018-01-27 ENCOUNTER — Ambulatory Visit (INDEPENDENT_AMBULATORY_CARE_PROVIDER_SITE_OTHER): Payer: Medicaid Other | Admitting: Obstetrics and Gynecology

## 2018-01-27 VITALS — BP 128/75 | HR 81

## 2018-01-27 DIAGNOSIS — O0993 Supervision of high risk pregnancy, unspecified, third trimester: Secondary | ICD-10-CM

## 2018-01-27 DIAGNOSIS — O0990 Supervision of high risk pregnancy, unspecified, unspecified trimester: Secondary | ICD-10-CM

## 2018-01-27 DIAGNOSIS — O26873 Cervical shortening, third trimester: Secondary | ICD-10-CM

## 2018-01-27 DIAGNOSIS — Z302 Encounter for sterilization: Secondary | ICD-10-CM

## 2018-01-27 DIAGNOSIS — O99810 Abnormal glucose complicating pregnancy: Secondary | ICD-10-CM | POA: Diagnosis not present

## 2018-01-27 NOTE — Progress Notes (Signed)
Subjective:  Christy Holden is a 32 y.o. [phone] at [redacted]w[redacted]d being seen today for ongoing prenatal care.  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  Patient reports no complaints.  Contractions: Not present. Vag. Bleeding: None.  Movement: Present. Denies leaking of fluid.   The following portions of the patient's history were reviewed and updated as appropriate: allergies, current medications, past family history, past medical history, past social history, past surgical history and problem list. Problem list updated.  Objective:   Vitals:   01/27/18 1441  BP: 128/75  Pulse: 81    Fetal Status: Fetal Heart Rate (bpm): 138 Fundal Height: 35 cm Movement: Present     General:  Alert, oriented and cooperative. Patient is in no acute distress.  Skin: Skin is warm and dry. No rash noted.   Cardiovascular: Normal heart rate noted  Respiratory: Normal respiratory effort, no problems with respiration noted  Abdomen: Soft, gravid, appropriate for gestational age. Pain/Pressure: Present     Pelvic: Vag. Bleeding: None     Cervical exam deferred        Extremities: Normal range of motion.  Edema: Trace  Mental Status: Normal mood and affect. Normal behavior. Normal judgment and thought content.   Urinalysis:      Assessment and Plan:  Pregnancy: G4P1021 at [redacted]w[redacted]d  1. Supervision of high risk pregnancy, antepartum Doing well. Routine prenatal care. Will need cultures collected at next visit.   2. Abnormal glucose tolerance test (GTT) during pregnancy, antepartum Failed early 1 hr GTT. 2hr GTT wnl. No further testing needed.   3. Request for  sterilization Desires BTL. Consent signed.   4. Short cervix during pregnancy in third trimester Continue Prometrium. Can discontinue at 37 weeks. Patient counseled.   Preterm labor symptoms and general obstetric precautions including but not limited to vaginal bleeding, contractions, leaking of fluid and fetal movement were reviewed in detail with the patient. Please refer to After Visit Summary for other counseling recommendations.  Return in about 2 weeks (around 02/10/2018) for ob visit.   Pincus Large, DO

## 2018-01-27 NOTE — Patient Instructions (Addendum)
Places to have your son circumcised:    [facility] [phone] $480 while you are in hospital  [facility] [phone] $244 by 4 wks  [facility] [phone] $[phone] by 2 wks  [facility] [phone] $250 by 7 days [facility] [phone] $[phone] by 4 wks  These prices sometimes change but are roughly what you can expect to pay. Please call and confirm pricing.   Circumcision is considered an elective/non-medically necessary procedure. There are many reasons parents decide to have their sons circumsized. During the first year of life circumcised males have a reduced risk of urinary tract infections but after this year the rates between circumcised males and uncircumcised males are the same.  It is safe to have your son circumcised outside of the hospital and the places above perform them regularly.   Deciding about Circumcision in Baby Boys  (Up-to-date The Basics)  What is circumcision?  Circumcision is a surgery that removes the skin that covers the tip of the penis, called the "foreskin" Circumcision is usually done when a boy is between 1 and 10 days old. In the [place], circumcision is common. In some other countries, fewer boys are circumcised. Circumcision is a common tradition in some religions.  Should I have my baby boy circumcised?  There is no easy answer. Circumcision has some benefits. But it also has risks. After talking with your doctor, you will have to decide for yourself what is right for your family.  What are the benefits of circumcision?  Circumcised boys seem to have slightly lower rates of: ?Urinary tract infections ?Swelling of the opening at the tip of the penis Circumcised men seem to have slightly lower rates of: ?Urinary tract infections ?Swelling of the opening at the tip of the penis ?Penis  cancer ?HIV and other infections that you catch during sex ?Cervical cancer in the women they have sex with Even so, in the [place], the risks of these problems are small - even in boys and men who have not been circumcised. Plus, boys and men who are not circumcised can reduce these extra risks by: ?Cleaning their penis well ?Using condoms during sex  What are the risks of circumcision?  Risks include: ?Bleeding or infection from the surgery ?Damage to or amputation of the penis ?A chance that the doctor will cut off too much or not enough of the foreskin ?A chance that sex won't feel as good later in life Only about 1 out of every 200 circumcisions leads to problems. There is also a chance that your health insurance won't pay for circumcision.  How is circumcision done in baby boys?  First, the baby gets medicine for pain relief. This might be a cream on the skin or a shot into the base of the penis. Next, the doctor cleans the baby's penis well. Then he or she uses special tools to cut off the foreskin. Finally, the doctor wraps a bandage (called gauze) around the baby's penis. If you have your baby circumcised, his doctor or nurse will give you instructions on how to care for him after the surgery. It is important that you follow those instructions carefully. AREA PEDIATRIC/FAMILY PRACTICE PHYSICIANS  [facility] [address], [address] [place], [place]  [address] Phone - [phone]   Fax - [phone]  [facility] [address] [place], [place] [address] Phone - [phone]   Fax - [phone]  [facility] [address] [place], [place]    [address] Phone - [phone]   Fax - [phone]  [facility] [address] [place], [place]  [address] Phone - [phone]   Fax - [phone]  [facility] [address] [place], [place]  [address] Phone - [phone]   Fax -  [phone]  [facility] [address], [address] [place], [place]  [address] Phone - [phone]   Fax - [phone]  [facility] [address], [address] [place], [place]  [address] Phone - [phone]   Fax - [phone]  [facility] [address], [address] [place], [place]  [address] Phone - [phone]   Fax - [phone]  [facility] [address] [place], [place]  [address] Phone - [phone]   Fax - [phone] [facility] [address] [place], [place]  [address] Phone - [phone]   Fax - [phone]  [facility] [address] [place], [place]  [address] Phone - [phone]   Fax - [phone]  [facility] [address] [place], [place]  [address] Phone - [phone]   Fax - [phone]  [facility] [address], [address] [place], [place]  [address] Phone - [phone]   Fax - [phone]  [facility] [address] [place], [place]  [address] Phone - [phone]  [facility] [address] [place], [place]  [address] Phone - [phone]   Fax - [phone]  [facility] [address] [place], [place]  [address] Phone - [phone]   Fax - [phone]  [facility] [address] [place], [place]  [address] Phone - [phone]   Fax - [phone]  [facility] [address] [place], [place]  [address] Phone - [phone]   Fax - [phone]   [facility] [address] [place], [place]  [address] Phone - [phone]   Fax - [phone]  [facility] [address] [place], [place]  [address] Phone - [phone]   Fax - [phone]  [facility] [address] , [place]  [address] Phone - [phone]   Fax - [phone]  [facility] [address] , [place]  [address] Phone - [phone]   Fax - [phone]  [facility] [address] , [place]  [address] Phone - [phone]   Fax - [phone] [facility] [address] [place], [place]  [address] Phone - [phone]   Fax - [phone]  [facility] [address] [place], [place]  [address] Phone - [phone]   Fax - [phone]  [facility] [address] [place], [place]  [address] Phone - [phone]   Fax - [phone]  [facility] Charlene Flemming MD [address] [place]  [address] Phone [phone]  Fax [phone]   

## 2018-02-06 ENCOUNTER — Encounter (HOSPITAL_COMMUNITY): Payer: Self-pay | Admitting: *Deleted

## 2018-02-06 ENCOUNTER — Inpatient Hospital Stay (HOSPITAL_COMMUNITY)
Admission: AD | Admit: 2018-02-06 | Discharge: 2018-02-06 | Disposition: A | Discharge status: Home / Self Care | Payer: Medicaid Other | Source: Ambulatory Visit | Attending: Obstetrics & Gynecology | Admitting: Obstetrics & Gynecology

## 2018-02-06 DIAGNOSIS — Z3689 Encounter for other specified antenatal screening: Secondary | ICD-10-CM

## 2018-02-06 DIAGNOSIS — O26873 Cervical shortening, third trimester: Secondary | ICD-10-CM

## 2018-02-06 DIAGNOSIS — O26893 Other specified pregnancy related conditions, third trimester: Secondary | ICD-10-CM | POA: Diagnosis not present

## 2018-02-06 DIAGNOSIS — Z3A36 36 weeks gestation of pregnancy: Secondary | ICD-10-CM

## 2018-02-06 DIAGNOSIS — N898 Other specified noninflammatory disorders of vagina: Secondary | ICD-10-CM | POA: Diagnosis not present

## 2018-02-06 DIAGNOSIS — Z87891 Personal history of nicotine dependence: Secondary | ICD-10-CM | POA: Diagnosis not present

## 2018-02-06 DIAGNOSIS — Z302 Encounter for sterilization: Secondary | ICD-10-CM

## 2018-02-06 DIAGNOSIS — O99810 Abnormal glucose complicating pregnancy: Secondary | ICD-10-CM

## 2018-02-06 LAB — WET PREP, GENITAL
Clue Cells Wet Prep HPF POC: NONE SEEN
SPERM: NONE SEEN
Trich, Wet Prep: NONE SEEN
Yeast Wet Prep HPF POC: NONE SEEN

## 2018-02-06 LAB — AMNISURE RUPTURE OF MEMBRANE (ROM) NOT AT ARMC: Amnisure ROM: NEGATIVE

## 2018-02-06 LAB — POCT FERN TEST: POCT Fern Test: NEGATIVE

## 2018-02-06 NOTE — Discharge Instructions (Signed)
Braxton Hicks Contractions °Contractions of the uterus can occur throughout pregnancy, but they are not always a sign that you are in labor. You may have practice contractions called Braxton Hicks contractions. These false labor contractions are sometimes confused with true labor. °What are Braxton Hicks contractions? °Braxton Hicks contractions are tightening movements that occur in the muscles of the uterus before labor. Unlike true labor contractions, these contractions do not result in opening (dilation) and thinning of the cervix. Toward the end of pregnancy (32-34 weeks), Braxton Hicks contractions can happen more often and may become stronger. These contractions are sometimes difficult to tell apart from true labor because they can be very uncomfortable. You should not feel embarrassed if you go to the hospital with false labor. °Sometimes, the only way to tell if you are in true labor is for your health care provider to look for changes in the cervix. The health care provider will do a physical exam and may monitor your contractions. If you are not in true labor, the exam should show that your cervix is not dilating and your water has not broken. °If there are other health problems associated with your pregnancy, it is completely safe for you to be sent home with false labor. You may continue to have Braxton Hicks contractions until you go into true labor. °How to tell the difference between true labor and false labor °True labor °· Contractions last 30-70 seconds. °· Contractions become very regular. °· Discomfort is usually felt in the top of the uterus, and it spreads to the lower abdomen and low back. °· Contractions do not go away with walking. °· Contractions usually become more intense and increase in frequency. °· The cervix dilates and gets thinner. °False labor °· Contractions are usually shorter and not as strong as true labor contractions. °· Contractions are usually irregular. °· Contractions  are often felt in the front of the lower abdomen and in the groin. °· Contractions may go away when you walk around or change positions while lying down. °· Contractions get weaker and are shorter-lasting as time goes on. °· The cervix usually does not dilate or become thin. °Follow these instructions at home: °· Take over-the-counter and prescription medicines only as told by your health care provider. °· Keep up with your usual exercises and follow other instructions from your health care provider. °· Eat and drink lightly if you think you are going into labor. °· If Braxton Hicks contractions are making you uncomfortable: °? Change your position from lying down or resting to walking, or change from walking to resting. °? Sit and rest in a tub of warm water. °? Drink enough fluid to keep your urine pale yellow. Dehydration may cause these contractions. °? Do slow and deep breathing several times an hour. °· Keep all follow-up prenatal visits as told by your health care provider. This is important. °Contact a health care provider if: °· You have a fever. °· You have continuous pain in your abdomen. °Get help right away if: °· Your contractions become stronger, more regular, and closer together. °· You have fluid leaking or gushing from your vagina. °· You pass blood-tinged mucus (bloody show). °· You have bleeding from your vagina. °· You have low back pain that you never had before. °· You feel your baby’s head pushing down and causing pelvic pressure. °· Your baby is not moving inside you as much as it used to. °Summary °· Contractions that occur before labor are called Braxton   Hicks contractions, false labor, or practice contractions. °· Braxton Hicks contractions are usually shorter, weaker, farther apart, and less regular than true labor contractions. True labor contractions usually become progressively stronger and regular and they become more frequent. °· Manage discomfort from Braxton Hicks contractions by  changing position, resting in a warm bath, drinking plenty of water, or practicing deep breathing. °This information is not intended to replace advice given to you by your health care provider. Make sure you discuss any questions you have with your health care provider. °Document Released: 01/02/2017 Document Revised: 01/02/2017 Document Reviewed: 01/02/2017 °Elsevier Interactive Patient Education © 2018 Elsevier Inc. ° °

## 2018-02-06 NOTE — MAU Note (Signed)
Pt reports gushes of fluid off/on for 2 days, abd pain off/on.

## 2018-02-06 NOTE — MAU Provider Note (Signed)
History   [id number]   Chief Complaint  Patient presents with  . Rupture of Membranes    HPI Christy Holden is a 32 y.o. female  [phone] @36 .0 wks here with report of leaking thick clear fluid.  Started 2 days ago. No gush of fluid. Was treated for yeast about 1 mo ago. Leaking of fluid has continued. Pt reports irregular contractions. She denies vaginal bleeding. Last intercourse was months ago. She reports good fetal movement. All other systems negative.    Patient's last menstrual period was 05/30/2017.  OB History  Gravida Para Term Preterm AB Living  4 1 1   2 1   SAB TAB Ectopic Multiple Live Births    2     1    # Outcome Date GA Lbr Len/2nd Weight Sex Delivery Anes PTL Lv  4 Current           3 TAB           2 Term      Vag-Spont   LIV  1 TAB             Past Medical History:  Diagnosis Date  . Chlamydia   . Migraine headache 07/11/2013  . Vaginal Pap smear, abnormal     Family History  Problem Relation Age of Onset  . Hypertension Mother   . Hypertension Father   . Kidney disease Maternal Grandmother     Social History   Socioeconomic History  . Marital status: Single    Spouse name: Not on file  . Number of children: Not on file  . Years of education: Not on file  . Highest education level: Not on file  Occupational History  . Not on file  Social Needs  . Financial resource strain: Not on file  . Food insecurity:    Worry: Not on file    Inability: Not on file  . Transportation needs:    Medical: Not on file    Non-medical: Not on file  Tobacco Use  . Smoking status: Former Smoker    Packs/day: 0.50    Years: 7.00    Pack years: 3.50    Types: Cigarettes  . Smokeless tobacco: Never Used  . Tobacco comment: unable to smoke for past week r/t n/v  Substance and Sexual Activity  . Alcohol use: No    Alcohol/week: 0.0 oz    Frequency: Never  . Drug use: Not Currently    Types: Marijuana    Comment: not currently  . Sexual activity: Yes     Birth control/protection: None  Lifestyle  . Physical activity:    Days per week: Not on file    Minutes per session: Not on file  . Stress: Not on file  Relationships  . Social connections:    Talks on phone: Not on file    Gets together: Not on file    Attends religious service: Not on file    Active member of club or organization: Not on file    Attends meetings of clubs or organizations: Not on file    Relationship status: Not on file  Other Topics Concern  . Not on file  Social History Narrative  . Not on file    No Known Allergies  No current facility-administered medications on file prior to encounter.    Current Outpatient Medications on File Prior to Encounter  Medication Sig Dispense Refill  . Prenatal Vit-Fe Fumarate-FA (PRENATAL MULTIVITAMIN) TABS tablet Take 1 tablet  by mouth daily at 12 noon.    . progesterone (PROMETRIUM) 200 MG capsule Place 1 capsule (200 mg total) vaginally at bedtime. (Patient not taking: Reported on 02/06/2018) 30 capsule 2     Review of Systems  Gastrointestinal: Negative for abdominal pain.  Genitourinary: Positive for vaginal discharge.     Physical Exam   Vitals:   02/06/18 1311  BP: 131/72  Pulse: 89  Resp: 16  Temp: 99 F (37.2 C)  TempSrc: Oral  SpO2: 100%  Weight: 203 lb (92.1 kg)  Height: 5\' 4"  (1.626 m)    Physical Exam  Constitutional: She is oriented to person, place, and time. She appears well-developed and well-nourished. No distress.  HENT:  Head: Normocephalic and atraumatic.  Neck: Normal range of motion.  Respiratory: Effort normal. No respiratory distress.  Genitourinary:  Genitourinary Comments: SSE: +yellow thick discharge, no pool, fern neg, visually closed   Musculoskeletal: Normal range of motion.  Neurological: She is alert and oriented to person, place, and time.  Skin: Skin is warm.  Psychiatric: She has a normal mood and affect.  EFM: 135 bpm, mod variability, + accels, no decels Toco:  rare, irritability  Results for orders placed or performed during the hospital encounter of 02/06/18 (from the past 24 hour(s))  POCT fern test     Status: None   Collection Time: 02/06/18  2:36 PM  Result Value Ref Range   POCT Fern Test Negative = intact amniotic membranes   Amnisure rupture of membrane (rom)not at [facility]     Status: None   Collection Time: 02/06/18  2:51 PM  Result Value Ref Range   Amnisure ROM NEGATIVE   Wet prep, genital     Status: Abnormal   Collection Time: 02/06/18  2:51 PM  Result Value Ref Range   Yeast Wet Prep HPF POC NONE SEEN NONE SEEN   Trich, Wet Prep NONE SEEN NONE SEEN   Clue Cells Wet Prep HPF POC NONE SEEN NONE SEEN   WBC, Wet Prep HPF POC MANY (A) NONE SEEN   Sperm NONE SEEN    MAU Course  Procedures  MDM Labs ordered and reviewed. No evidence of SROM or infection. Stable for discharge home.  Assessment and Plan  [redacted] weeks gestation Reactive NST Vaginal discharge in pregnancy Discharge home Follow up in OB office next week PTL precautions  Allergies as of 02/06/2018   No Known Allergies     Medication List    STOP taking these medications   progesterone 200 MG capsule Commonly known as:  PROMETRIUM     TAKE these medications   prenatal multivitamin Tabs tablet Take 1 tablet by mouth daily at 12 noon.      Donette Larry, CNM 02/06/2018 3:28 PM

## 2018-02-10 ENCOUNTER — Encounter: Payer: Self-pay | Admitting: Family Medicine

## 2018-02-10 ENCOUNTER — Other Ambulatory Visit (HOSPITAL_COMMUNITY)
Admission: RE | Admit: 2018-02-10 | Discharge: 2018-02-10 | Disposition: A | Discharge status: Home / Self Care | Payer: Medicaid Other | Source: Ambulatory Visit | Attending: Family Medicine | Admitting: Family Medicine

## 2018-02-10 ENCOUNTER — Ambulatory Visit (INDEPENDENT_AMBULATORY_CARE_PROVIDER_SITE_OTHER): Payer: Medicaid Other | Admitting: Family Medicine

## 2018-02-10 ENCOUNTER — Encounter: Payer: Self-pay | Admitting: General Practice

## 2018-02-10 VITALS — BP 135/88 | HR 90 | Wt 203.5 lb

## 2018-02-10 DIAGNOSIS — O0990 Supervision of high risk pregnancy, unspecified, unspecified trimester: Secondary | ICD-10-CM

## 2018-02-10 DIAGNOSIS — Z3A36 36 weeks gestation of pregnancy: Secondary | ICD-10-CM | POA: Diagnosis not present

## 2018-02-10 DIAGNOSIS — K219 Gastro-esophageal reflux disease without esophagitis: Secondary | ICD-10-CM

## 2018-02-10 DIAGNOSIS — O0993 Supervision of high risk pregnancy, unspecified, third trimester: Secondary | ICD-10-CM | POA: Insufficient documentation

## 2018-02-10 MED ORDER — OMEPRAZOLE MAGNESIUM 20 MG PO TBEC
20.0000 mg | DELAYED_RELEASE_TABLET | Freq: Every day | ORAL | 3 refills | Status: DC
Start: 2018-02-10 — End: 2022-09-25

## 2018-02-10 NOTE — Patient Instructions (Signed)

## 2018-02-11 NOTE — Progress Notes (Signed)
   PRENATAL VISIT NOTE  Subjective:  Christy Holden is a 32 y.o. [phone] at 589w5d being seen today for ongoing prenatal care.  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  Patient reports heartburn.  Contractions: Irritability. Vag. Bleeding: None.  Movement: Present. Denies leaking of fluid.   The following portions of the patient's history were reviewed and updated as appropriate: allergies, current medications, past family history, past medical history, past social history, past surgical history and problem list. Problem list updated.  Objective:   Vitals:   02/10/18 0842  BP: 135/88  Pulse: 90  Weight: 203 lb 8 oz (92.3 kg)    Fetal Status: Fetal Heart Rate (bpm): 136 Fundal Height: 35 cm Movement: Present  Presentation: Vertex  General:  Alert, oriented and cooperative. Patient is in no acute distress.  Skin: Skin is warm and dry. No rash noted.   Cardiovascular: Normal heart rate noted  Respiratory: Normal respiratory effort, no problems with respiration noted  Abdomen: Soft, gravid, appropriate for gestational age.  Pain/Pressure: Present     Pelvic: Cervical exam performed Dilation: 2 Effacement (%): 50 Station: Ballotable  Extremities: Normal range of motion.  Edema: Trace  Mental Status: Normal mood and affect. Normal behavior. Normal judgment and thought content.   Assessment and Plan:  Pregnancy: G4P1021 at 419w5d  1. Supervision of high risk pregnancy, antepartum Cultures today - Culture, beta strep (group b only) - GC/Chlamydia probe amp ([place]  2. Gastroesophageal reflux disease without esophagitis PPI -  omeprazole (PRILOSEC OTC) 20 MG tablet; Take 1 tablet (20 mg total) by mouth daily.  Dispense: 30 tablet; Refill: 3  Preterm labor symptoms and general obstetric precautions including but not limited to vaginal bleeding, contractions, leaking of fluid and fetal movement were reviewed in detail with the patient. Please refer to After Visit Summary for other counseling recommendations.  Return in 1 week (on 02/17/2018).  Future Appointments  Date Time Provider Department Center  02/17/2018  9:55 AM Degele, Kandra NicolasJulie P, MD [facility]  02/24/2018  8:55 AM Anyanwu, Jethro BastosUgonna A, MD [facility]    Reva Boresanya S Ambyr Qadri, MD

## 2018-02-12 LAB — GC/CHLAMYDIA PROBE AMP (~~LOC~~) NOT AT ARMC
CHLAMYDIA, DNA PROBE: NEGATIVE
Neisseria Gonorrhea: NEGATIVE

## 2018-02-14 LAB — CULTURE, BETA STREP (GROUP B ONLY): Strep Gp B Culture: NEGATIVE

## 2018-02-17 ENCOUNTER — Ambulatory Visit (INDEPENDENT_AMBULATORY_CARE_PROVIDER_SITE_OTHER): Payer: Medicaid Other | Admitting: Family Medicine

## 2018-02-17 VITALS — BP 123/78 | HR 73 | Wt 201.7 lb

## 2018-02-17 DIAGNOSIS — O0993 Supervision of high risk pregnancy, unspecified, third trimester: Secondary | ICD-10-CM

## 2018-02-17 DIAGNOSIS — Z3009 Encounter for other general counseling and advice on contraception: Secondary | ICD-10-CM

## 2018-02-17 DIAGNOSIS — Z302 Encounter for sterilization: Secondary | ICD-10-CM

## 2018-02-17 DIAGNOSIS — K219 Gastro-esophageal reflux disease without esophagitis: Secondary | ICD-10-CM | POA: Diagnosis not present

## 2018-02-17 DIAGNOSIS — O0990 Supervision of high risk pregnancy, unspecified, unspecified trimester: Secondary | ICD-10-CM

## 2018-02-17 NOTE — Patient Instructions (Signed)
Food Choices for Gastroesophageal Reflux Disease, Adult When you have gastroesophageal reflux disease (GERD), the foods you eat and your eating habits are very important. Choosing the right foods can help ease your discomfort. What guidelines do I need to follow?  Choose fruits, vegetables, whole grains, and low-fat dairy products.  Choose low-fat meat, fish, and poultry.  Limit fats such as oils, salad dressings, butter, nuts, and avocado.  Keep a food diary. This helps you identify foods that cause symptoms.  Avoid foods that cause symptoms. These may be different for everyone.  Eat small meals often instead of 3 large meals a day.  Eat your meals slowly, in a place where you are relaxed.  Limit fried foods.  Cook foods using methods other than frying.  Avoid drinking alcohol.  Avoid drinking large amounts of liquids with your meals.  Avoid bending over or lying down until 2-3 hours after eating. What foods are not recommended? These are some foods and drinks that may make your symptoms worse: Vegetables  Tomatoes. Tomato juice. Tomato and spaghetti sauce. Chili peppers. Onion and garlic. Horseradish. Fruits  Oranges, grapefruit, and lemon (fruit and juice). Meats  High-fat meats, fish, and poultry. This includes hot dogs, ribs, ham, sausage, salami, and bacon. Dairy  Whole milk and chocolate milk. Sour cream. Cream. Butter. Ice cream. Cream cheese. Drinks  Coffee and tea. Bubbly (carbonated) drinks or energy drinks. Condiments  Hot sauce. Barbecue sauce. Sweets/Desserts  Chocolate and cocoa. Donuts. Peppermint and spearmint. Fats and Oils  High-fat foods. This includes French fries and potato chips. Other  Vinegar. Strong spices. This includes black pepper, white pepper, red pepper, cayenne, curry powder, cloves, ginger, and chili powder. The items listed above may not be a complete list of foods and drinks to avoid. Contact your dietitian for more information.    This information is not intended to replace advice given to you by your health care provider. Make sure you discuss any questions you have with your health care provider. Document Released: 02/18/2012 Document Revised: 01/25/2016 Document Reviewed: 06/23/2013 Elsevier Interactive Patient Education  2017 Elsevier Inc.  

## 2018-02-17 NOTE — Progress Notes (Signed)
   PRENATAL VISIT NOTE  Subjective:  Christy Holden is a 32 y.o. [phone] at 10220w4d being seen today for ongoing prenatal care.  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  Patient reports contractions.  Contractions: Irritability. Vag. Bleeding: None.  Movement: Present. Denies leaking of fluid. Also reports persistent nausea and vomiting. Only started taking PPI about 2 days ago.   The following portions of the patient's history were reviewed and updated as appropriate: allergies, current medications, past family history, past medical history, past social history, past surgical history and problem list. Problem list updated.  Objective:   Vitals:   02/17/18 1010  BP: 123/78  Pulse: 73  Weight: 201 lb 11.2 oz (91.5 kg)    Fetal Status: Fetal Heart Rate (bpm): 131 Fundal Height: 37 cm Movement: Present  Presentation: Vertex  General:  Alert, oriented and cooperative. Patient is in no acute distress.  Skin: Skin is warm and dry. No rash noted.   Cardiovascular: Normal heart rate noted  Respiratory: Normal respiratory effort, no problems with respiration noted  Abdomen: Soft, gravid, appropriate for gestational age.  Pain/Pressure: Present     Pelvic: Cervical exam performed Dilation: 2.5 Effacement (%): 50 Station: Ballotable  Extremities: Normal range of motion.  Edema: Trace  Mental Status: Normal mood and affect. Normal behavior. Normal judgment and thought content.   Assessment and Plan:  Pregnancy: G4P1021 at 1220w4d  1. Supervision of high risk pregnancy, antepartum - GBS neg - Routine care  2. Request for sterilization - BTL papers  signed  3. GERD, nausea, vomiting - Just started PPI. - Advised on frequent small meals and decreased high fat intake  Term labor symptoms and general obstetric precautions including but not limited to vaginal bleeding, contractions, leaking of fluid and fetal movement were reviewed in detail with the patient. Please refer to After Visit Summary for other counseling recommendations.  Return in about 1 week (around 02/24/2018).  Future Appointments  Date Time Provider Department Center  02/24/2018  8:55 AM Anyanwu, [facility]osUgonna A, [facility]    Frederik PearJulie P Cavion Faiola, MD

## 2018-02-17 NOTE — Progress Notes (Signed)
Pt states is having severe emesis feels dehydrated & contractions.

## 2018-02-22 ENCOUNTER — Inpatient Hospital Stay (HOSPITAL_COMMUNITY): Payer: Medicaid Other | Admitting: Certified Registered Nurse Anesthetist

## 2018-02-22 ENCOUNTER — Encounter (HOSPITAL_COMMUNITY)
Admission: AD | Disposition: A | Discharge status: Home / Self Care | Payer: Self-pay | Source: Home / Self Care | Attending: Obstetrics and Gynecology

## 2018-02-22 ENCOUNTER — Encounter (HOSPITAL_COMMUNITY): Payer: Self-pay | Admitting: *Deleted

## 2018-02-22 ENCOUNTER — Inpatient Hospital Stay (HOSPITAL_COMMUNITY)
Admission: AD | Admit: 2018-02-22 | Discharge: 2018-02-24 | DRG: 798 | Disposition: A | Discharge status: Home / Self Care | Payer: Medicaid Other | Attending: Obstetrics and Gynecology | Admitting: Obstetrics and Gynecology

## 2018-02-22 DIAGNOSIS — Z3A38 38 weeks gestation of pregnancy: Secondary | ICD-10-CM

## 2018-02-22 DIAGNOSIS — Z3483 Encounter for supervision of other normal pregnancy, third trimester: Secondary | ICD-10-CM | POA: Diagnosis present

## 2018-02-22 DIAGNOSIS — Z87891 Personal history of nicotine dependence: Secondary | ICD-10-CM

## 2018-02-22 DIAGNOSIS — O99810 Abnormal glucose complicating pregnancy: Secondary | ICD-10-CM

## 2018-02-22 DIAGNOSIS — Z302 Encounter for sterilization: Secondary | ICD-10-CM

## 2018-02-22 DIAGNOSIS — O26873 Cervical shortening, third trimester: Secondary | ICD-10-CM

## 2018-02-22 HISTORY — PX: TUBAL LIGATION: SHX77

## 2018-02-22 LAB — CREATININE, SERUM
Creatinine, Ser: 0.5 mg/dL (ref 0.44–1.00)
GFR calc Af Amer: >60 mL/min (ref >60)

## 2018-02-22 LAB — CBC
HCT: 36.9 % (ref 36.0–46.0)
HEMOGLOBIN: 12.5 g/dL (ref 12.0–15.0)
MCH: 30.9 pg (ref 26.0–34.0)
MCHC: 33.9 g/dL (ref 30.0–36.0)
MCV: 91.3 fL (ref 78.0–100.0)
PLATELETS: 296 10*3/uL (ref 150–400)
RBC: 4.04 MIL/uL (ref 3.87–5.11)
RDW: 13.7 % (ref 11.5–15.5)
WBC: 10.6 10*3/uL — AB (ref 4.0–10.5)

## 2018-02-22 LAB — TYPE AND SCREEN
ABO/RH(D): O POS
Antibody Screen: NEGATIVE

## 2018-02-22 LAB — POCT FERN TEST: POCT FERN TEST: POSITIVE

## 2018-02-22 LAB — RPR: RPR Ser Ql: NONREACTIVE

## 2018-02-22 SURGERY — LIGATION, FALLOPIAN TUBE, POSTPARTUM
Anesthesia: Spinal | Site: Abdomen | Laterality: Bilateral | Wound class: Clean Contaminated

## 2018-02-22 MED ORDER — FENTANYL CITRATE (PF) 100 MCG/2ML IJ SOLN
INTRAMUSCULAR | Status: AC
  Filled 2018-02-22: qty 2

## 2018-02-22 MED ORDER — DIBUCAINE 1 % RE OINT: 1.0000 "application " | TOPICAL_OINTMENT | RECTAL | Status: DC | PRN

## 2018-02-22 MED ORDER — PRENATAL MULTIVITAMIN CH: 1.0000 | ORAL_TABLET | Freq: Every day | ORAL | Status: DC

## 2018-02-22 MED ORDER — COCONUT OIL OIL: 1.0000 "application " | TOPICAL_OIL | Status: DC | PRN

## 2018-02-22 MED ORDER — SODIUM CHLORIDE 0.9% FLUSH: 3.0000 mL | INTRAVENOUS | Status: DC | PRN

## 2018-02-22 MED ORDER — LIDOCAINE HCL (PF) 1 % IJ SOLN
30.0000 mL | INTRAMUSCULAR | Status: DC | PRN
  Filled 2018-02-22: qty 30

## 2018-02-22 MED ORDER — ONDANSETRON HCL 4 MG PO TABS: 4.0000 mg | ORAL_TABLET | ORAL | Status: DC | PRN

## 2018-02-22 MED ORDER — OXYCODONE HCL 5 MG PO TABS
5.0000 mg | ORAL_TABLET | ORAL | Status: DC | PRN
  Administered 2018-02-22: 5 mg via ORAL
  Filled 2018-02-22: qty 1

## 2018-02-22 MED ORDER — ZOLPIDEM TARTRATE 5 MG PO TABS: 5.0000 mg | ORAL_TABLET | Freq: Every evening | ORAL | Status: DC | PRN

## 2018-02-22 MED ORDER — WITCH HAZEL-GLYCERIN EX PADS: 1.0000 "application " | MEDICATED_PAD | CUTANEOUS | Status: DC | PRN

## 2018-02-22 MED ORDER — BUPIVACAINE HCL (PF) 0.25 % IJ SOLN
INTRAMUSCULAR | Status: AC
  Filled 2018-02-22: qty 30

## 2018-02-22 MED ORDER — MEASLES, MUMPS & RUBELLA VAC ~~LOC~~ INJ
0.5000 mL | INJECTION | Freq: Once | SUBCUTANEOUS | Status: DC
  Filled 2018-02-22: qty 0.5

## 2018-02-22 MED ORDER — ACETAMINOPHEN 325 MG PO TABS: 650.0000 mg | ORAL_TABLET | ORAL | Status: DC | PRN

## 2018-02-22 MED ORDER — SENNOSIDES-DOCUSATE SODIUM 8.6-50 MG PO TABS: 2.0000 | ORAL_TABLET | ORAL | Status: DC

## 2018-02-22 MED ORDER — MEPERIDINE HCL 25 MG/ML IJ SOLN: 6.2500 mg | INTRAMUSCULAR | Status: DC | PRN

## 2018-02-22 MED ORDER — PRENATAL MULTIVITAMIN CH
1.0000 | ORAL_TABLET | Freq: Every day | ORAL | Status: DC
  Administered 2018-02-23: 1 via ORAL
  Filled 2018-02-22: qty 1

## 2018-02-22 MED ORDER — IBUPROFEN 600 MG PO TABS
600.0000 mg | ORAL_TABLET | Freq: Four times a day (QID) | ORAL | Status: DC
  Administered 2018-02-22 – 2018-02-24 (×7): 600 mg via ORAL
  Filled 2018-02-22 (×7): qty 1

## 2018-02-22 MED ORDER — SENNOSIDES-DOCUSATE SODIUM 8.6-50 MG PO TABS
2.0000 | ORAL_TABLET | ORAL | Status: DC
  Administered 2018-02-22 – 2018-02-23 (×2): 2 via ORAL
  Filled 2018-02-22 (×2): qty 2

## 2018-02-22 MED ORDER — DIPHENHYDRAMINE HCL 25 MG PO CAPS: 25.0000 mg | ORAL_CAPSULE | Freq: Four times a day (QID) | ORAL | Status: DC | PRN

## 2018-02-22 MED ORDER — ONDANSETRON HCL 4 MG/2ML IJ SOLN: 4.0000 mg | INTRAMUSCULAR | Status: DC | PRN

## 2018-02-22 MED ORDER — LACTATED RINGERS IV SOLN: INTRAVENOUS | Status: DC

## 2018-02-22 MED ORDER — SIMETHICONE 80 MG PO CHEW: 80.0000 mg | CHEWABLE_TABLET | ORAL | Status: DC | PRN

## 2018-02-22 MED ORDER — BUPIVACAINE HCL 0.25 % IJ SOLN
INTRAMUSCULAR | Status: DC | PRN
  Administered 2018-02-22: 20 mL

## 2018-02-22 MED ORDER — MIDAZOLAM HCL 2 MG/2ML IJ SOLN
INTRAMUSCULAR | Status: DC | PRN
  Administered 2018-02-22: 2 mg via INTRAVENOUS

## 2018-02-22 MED ORDER — BENZOCAINE-MENTHOL 20-0.5 % EX AERO
1.0000 "application " | INHALATION_SPRAY | CUTANEOUS | Status: DC | PRN
  Administered 2018-02-22: 1 via TOPICAL
  Filled 2018-02-22: qty 56

## 2018-02-22 MED ORDER — ONDANSETRON HCL 4 MG/2ML IJ SOLN
INTRAMUSCULAR | Status: AC
  Filled 2018-02-22: qty 2

## 2018-02-22 MED ORDER — COCONUT OIL OIL
1.0000 "application " | TOPICAL_OIL | Status: DC | PRN
  Administered 2018-02-23: 1 via TOPICAL
  Filled 2018-02-22: qty 120

## 2018-02-22 MED ORDER — ONDANSETRON HCL 4 MG/2ML IJ SOLN
INTRAMUSCULAR | Status: DC | PRN
  Administered 2018-02-22: 4 mg via INTRAVENOUS

## 2018-02-22 MED ORDER — MIDAZOLAM HCL 2 MG/2ML IJ SOLN
INTRAMUSCULAR | Status: AC
  Filled 2018-02-22: qty 2

## 2018-02-22 MED ORDER — LACTATED RINGERS IV SOLN
INTRAVENOUS | Status: DC | PRN
  Administered 2018-02-22: 12:00:00 via INTRAVENOUS

## 2018-02-22 MED ORDER — FLEET ENEMA 7-19 GM/118ML RE ENEM: 1.0000 | ENEMA | RECTAL | Status: DC | PRN

## 2018-02-22 MED ORDER — FENTANYL CITRATE (PF) 100 MCG/2ML IJ SOLN
INTRAMUSCULAR | Status: DC | PRN
  Administered 2018-02-22: 25 ug via INTRAVENOUS

## 2018-02-22 MED ORDER — ENOXAPARIN SODIUM 40 MG/0.4ML ~~LOC~~ SOLN
40.0000 mg | SUBCUTANEOUS | Status: DC
  Filled 2018-02-22: qty 0.4

## 2018-02-22 MED ORDER — OXYCODONE-ACETAMINOPHEN 5-325 MG PO TABS: 2.0000 | ORAL_TABLET | ORAL | Status: DC | PRN

## 2018-02-22 MED ORDER — METOCLOPRAMIDE HCL 5 MG/ML IJ SOLN: 10.0000 mg | Freq: Once | INTRAMUSCULAR | Status: DC | PRN

## 2018-02-22 MED ORDER — TETANUS-DIPHTH-ACELL PERTUSSIS 5-2.5-18.5 LF-MCG/0.5 IM SUSP: 0.5000 mL | Freq: Once | INTRAMUSCULAR | Status: DC

## 2018-02-22 MED ORDER — BUPIVACAINE IN DEXTROSE 0.75-8.25 % IT SOLN
INTRATHECAL | Status: DC | PRN
  Administered 2018-02-22: 1.2 mL via INTRATHECAL

## 2018-02-22 MED ORDER — FENTANYL CITRATE (PF) 100 MCG/2ML IJ SOLN
INTRAMUSCULAR | Status: AC
  Administered 2018-02-22: 100 ug via INTRAVENOUS
  Filled 2018-02-22: qty 2

## 2018-02-22 MED ORDER — IBUPROFEN 600 MG PO TABS: 600.0000 mg | ORAL_TABLET | Freq: Four times a day (QID) | ORAL | Status: DC

## 2018-02-22 MED ORDER — LACTATED RINGERS IV SOLN
INTRAVENOUS | Status: DC
  Administered 2018-02-22: 09:00:00 via INTRAVENOUS

## 2018-02-22 MED ORDER — LACTATED RINGERS IV SOLN: 500.0000 mL | INTRAVENOUS | Status: DC | PRN

## 2018-02-22 MED ORDER — SODIUM CHLORIDE 0.9% FLUSH: 3.0000 mL | Freq: Two times a day (BID) | INTRAVENOUS | Status: DC

## 2018-02-22 MED ORDER — SODIUM CHLORIDE 0.9 % IR SOLN
Status: DC | PRN
  Administered 2018-02-22: 1000 mL

## 2018-02-22 MED ORDER — OXYCODONE-ACETAMINOPHEN 5-325 MG PO TABS: 1.0000 | ORAL_TABLET | ORAL | Status: DC | PRN

## 2018-02-22 MED ORDER — OXYTOCIN BOLUS FROM INFUSION
500.0000 mL | Freq: Once | INTRAVENOUS | Status: AC
  Administered 2018-02-22: 500 mL via INTRAVENOUS

## 2018-02-22 MED ORDER — OXYTOCIN 40 UNITS IN LACTATED RINGERS INFUSION - SIMPLE MED
2.5000 [IU]/h | INTRAVENOUS | Status: DC
  Filled 2018-02-22: qty 1000

## 2018-02-22 MED ORDER — SODIUM CHLORIDE 0.9 % IV SOLN: 250.0000 mL | INTRAVENOUS | Status: DC | PRN

## 2018-02-22 MED ORDER — ONDANSETRON HCL 4 MG/2ML IJ SOLN: 4.0000 mg | Freq: Four times a day (QID) | INTRAMUSCULAR | Status: DC | PRN

## 2018-02-22 MED ORDER — BUPIVACAINE HCL (PF) 0.25 % IJ SOLN
INTRAMUSCULAR | Status: AC
  Filled 2018-02-22: qty 20

## 2018-02-22 MED ORDER — FENTANYL CITRATE (PF) 100 MCG/2ML IJ SOLN
25.0000 ug | INTRAMUSCULAR | Status: DC | PRN
Start: 2018-02-22 — End: 2018-02-22

## 2018-02-22 MED ORDER — ACETAMINOPHEN 325 MG PO TABS
650.0000 mg | ORAL_TABLET | ORAL | Status: DC | PRN
Start: 2018-02-22 — End: 2018-02-24
  Administered 2018-02-22: 650 mg via ORAL
  Filled 2018-02-22: qty 2

## 2018-02-22 MED ORDER — BENZOCAINE-MENTHOL 20-0.5 % EX AERO: 1.0000 "application " | INHALATION_SPRAY | CUTANEOUS | Status: DC | PRN

## 2018-02-22 MED ORDER — FENTANYL CITRATE (PF) 100 MCG/2ML IJ SOLN
100.0000 ug | INTRAMUSCULAR | Status: DC | PRN
  Administered 2018-02-22: 100 ug via INTRAVENOUS

## 2018-02-22 MED ORDER — BUPIVACAINE HCL (PF) 0.25 % IJ SOLN
INTRAMUSCULAR | Status: AC
  Filled 2018-02-22: qty 10

## 2018-02-22 MED ORDER — SOD CITRATE-CITRIC ACID 500-334 MG/5ML PO SOLN
30.0000 mL | ORAL | Status: DC | PRN
  Administered 2018-02-22: 30 mL via ORAL

## 2018-02-22 SURGICAL SUPPLY — 22 items
CLIP FILSHIE TUBAL LIGA STRL (Clip) ×3 IMPLANT
CLOTH BEACON ORANGE TIMEOUT ST (SAFETY) ×3 IMPLANT
DRSG OPSITE POSTOP 3X4 (GAUZE/BANDAGES/DRESSINGS) ×3 IMPLANT
DURAPREP 26ML APPLICATOR (WOUND CARE) ×3 IMPLANT
GLOVE BIOGEL PI IND STRL 7.0 (GLOVE) ×1 IMPLANT
GLOVE BIOGEL PI IND STRL 9 (GLOVE) ×2 IMPLANT
GLOVE BIOGEL PI INDICATOR 7.0 (GLOVE) ×2
GLOVE BIOGEL PI INDICATOR 9 (GLOVE) ×4
GLOVE ECLIPSE 9.0 STRL (GLOVE) ×3 IMPLANT
GOWN STRL REUS W/TWL 2XL LVL3 (GOWN DISPOSABLE) ×6 IMPLANT
GOWN STRL REUS W/TWL LRG LVL3 (GOWN DISPOSABLE) ×3 IMPLANT
NEEDLE HYPO 22GX1.5 SAFETY (NEEDLE) ×3 IMPLANT
NS IRRIG 1000ML POUR BTL (IV SOLUTION) ×3 IMPLANT
PACK ABDOMINAL MINOR (CUSTOM PROCEDURE TRAY) ×3 IMPLANT
PROTECTOR NERVE ULNAR (MISCELLANEOUS) ×3 IMPLANT
SPONGE LAP 4X18 X RAY DECT (DISPOSABLE) ×2 IMPLANT
SUT VIC AB 0 CT1 27 (SUTURE)
SUT VIC AB 0 CT1 27XBRD ANBCTR (SUTURE) IMPLANT
SUT VIC AB 4-0 PS2 27 (SUTURE) ×3 IMPLANT
SYR CONTROL 10ML LL (SYRINGE) ×3 IMPLANT
TOWEL OR 17X24 6PK STRL BLUE (TOWEL DISPOSABLE) ×6 IMPLANT
TRAY FOLEY CATH SILVER 14FR (SET/KITS/TRAYS/PACK) ×2 IMPLANT

## 2018-02-22 NOTE — Brief Op Note (Signed)
02/22/2018  1:02 PM  PATIENT:  Christy Holden  32 y.o. female  PRE-OPERATIVE DIAGNOSIS:  desires sterility, postpartum sterilization  POST-OPERATIVE DIAGNOSIS:  desires sterility, postpartum sterilization, Filshie clip  PROCEDURE:  Procedure(s): POST PARTUM TUBAL LIGATION (Bilateral), bilateral Filshie clip placement  SURGEON:  Surgeon(s) and Role:    * Tilda BurrowFerguson, Sereniti Wan V, MD - Primary  PHYSICIAN ASSISTANT:   ASSISTANTS: none   ANESTHESIA:   local and spinal  EBL:  3 mL   BLOOD ADMINISTERED:none  DRAINS: none   LOCAL MEDICATIONS USED:  MARCAINE    and Amount: 20 ml  SPECIMEN:  No Specimen  DISPOSITION OF SPECIMEN:  N/A  COUNTS:  YES  TOURNIQUET:  * No tourniquets in log *  DICTATION: .Dragon Dictation  PLAN OF CARE: Patient has admission orders already  PATIENT DISPOSITION:  PACU - hemodynamically stable.   Delay start of Pharmacological VTE agent (>24hrs) due to surgical blood loss or risk of bleeding: not applicable Indications 32 year old gravida 4 para 2-0-2-2 now recently postpartum with previously signed tubal sterilization forms which were reconfirmed.  Consent obtained and patient counseled over risk benefits and low reversibility of this procedure Details of procedure.  Patient was taken the operating room prepped and draped for lower abdominal umbilical surgery, with timeout conducted.  A infraumbilical transverse skin incision 2 cm in length was performed, with hemostat spreading of the subcutaneous fatty tissue overlying Allis clamps to grasp the edges of the fascia in the midline which was then opened with Metzenbaum scissors and the preperitoneal peritoneal surfaces identified and opened with a hemostat allowing access to the abdomen where the uterus could be identified.  Attention was first directed to the right side where the right round ligament could be identified grasped with Babcock clamp and the uterus rotated so that the right fallopian tube could be  identified.  It was identified to its fimbriated again elevated and Filshie clip placed on a midsegment knuckle of tube which was then infiltrated with 5 cc of Marcaine surrounding the clip.  Attention was then directed to the opposite side where again the round ligament could be identified the uterus rotated, the fallopian tube grasped elevated exteriorized sufficiently to identify the fimbriated and then Filshie clip applied to the midsegment portion of the tube with Marcaine injected and tube allowed to fall back into the abdomen without difficulty.  The fascial edges were grasped with Allis clamp and closed in continuous running 0 Vicryl closure, followed by 4-0 Vicryl closure of the subcu fatty space with one interrupted suture then subcuticular 4-0 Vicryl closure of the skin, Marcaine injection in the skin and patient to recovery room in good condition.

## 2018-02-22 NOTE — Transfer of Care (Signed)
Immediate Anesthesia Transfer of Care Note  Patient: Christy Holden  Procedure(s) Performed: POST PARTUM TUBAL LIGATION (Bilateral Abdomen)  Patient Location: PACU  Anesthesia Type:Spinal  Level of Consciousness: awake, alert  and oriented  Airway & Oxygen Therapy: Patient Spontanous Breathing  Post-op Assessment: Report given to RN and Post -op Vital signs reviewed and stable  Post vital signs: Reviewed and stable  Last Vitals:  Vitals Value Taken Time  BP    Temp    Pulse 61 02/22/2018  1:08 PM  Resp    SpO2 97 % 02/22/2018  1:08 PM  Vitals shown include unvalidated device data.  Last Pain:  Vitals:   02/22/18 1150  TempSrc:   PainSc: 0-No pain      Patients Stated Pain Goal: 6 (02/22/18 [id number])  Complications: No apparent anesthesia complications

## 2018-02-22 NOTE — Anesthesia Preprocedure Evaluation (Signed)
Anesthesia Evaluation  Patient identified by MRN, date of birth, ID band Patient awake    Reviewed: Allergy & Precautions, NPO status , Patient's Chart, lab work & pertinent test results  Airway Mallampati: II  TM Distance: >3 FB Neck ROM: Full    Dental no notable dental hx.    Pulmonary neg pulmonary ROS, former smoker,    Pulmonary exam normal breath sounds clear to auscultation       Cardiovascular negative cardio ROS Normal cardiovascular exam Rhythm:Regular Rate:Normal     Neuro/Psych negative neurological ROS  negative psych ROS   GI/Hepatic negative GI ROS, Neg liver ROS,   Endo/Other  negative endocrine ROS  Renal/GU negative Renal ROS  negative genitourinary   Musculoskeletal negative musculoskeletal ROS (+)   Abdominal   Peds negative pediatric ROS (+)  Hematology negative hematology ROS (+)   Anesthesia Other Findings   Reproductive/Obstetrics negative OB ROS                             Anesthesia Physical Anesthesia Plan  ASA: II  Anesthesia Plan: Spinal   Post-op Pain Management:    Induction:   PONV Risk Score and Plan: 2 and Ondansetron and Treatment may vary due to age or medical condition  Airway Management Planned: Natural Airway  Additional Equipment:   Intra-op Plan:   Post-operative Plan:   Informed Consent: I have reviewed the patients History and Physical, chart, labs and discussed the procedure including the risks, benefits and alternatives for the proposed anesthesia with the patient or authorized representative who has indicated his/her understanding and acceptance.   Dental advisory given  Plan Discussed with:   Anesthesia Plan Comments:         Anesthesia Quick Evaluation

## 2018-02-22 NOTE — MAU Note (Signed)
Christy Holden is a 32 y.o. at 5571w2d here in MAU reporting: +contractions Every 2-3 minutes +LOF clear since 745am Pain score: 9/10; wants an epidural Vitals:   02/22/18 0810 02/22/18 0846  BP: (!) 144/90 114/63  Pulse: 88 61  Resp: 20   Temp: (!) 97.5 F (36.4 C)      FHT:130 Lab orders placed from triage: fern slide

## 2018-02-22 NOTE — Anesthesia Procedure Notes (Signed)
Spinal  Patient location during procedure: OR Staffing Anesthesiologist: Nassim Cosma, MD Performed: anesthesiologist  Preanesthetic Checklist Completed: patient identified, site marked, surgical consent, pre-op evaluation, timeout performed, IV checked, risks and benefits discussed and monitors and equipment checked Spinal Block Patient position: sitting Prep: DuraPrep Patient monitoring: heart rate, continuous pulse ox and blood pressure Approach: right paramedian Location: L3-4 Injection technique: single-shot Needle Needle type: Sprotte  Needle gauge: 24 G Needle length: 9 cm Additional Notes Expiration date of kit checked and confirmed. Patient tolerated procedure well, without complications.       

## 2018-02-22 NOTE — H&P (Signed)
Christy Holden is a 32 y.o. female [id number] @ 38.2 wks  presenting for active labor. OB History    Gravida  4   Para  1   Term  1   Preterm      AB  2   Living  1     SAB      TAB  2   Ectopic      Multiple      Live Births  1          Past Medical History:  Diagnosis Date  . Chlamydia   . Migraine headache 07/11/2013  . Vaginal Pap smear, abnormal    Past Surgical History:  Procedure Laterality Date  . NO PAST SURGERIES     Family History: family history includes Hypertension in her father and mother; Kidney disease in her maternal grandmother. Social History:  reports that she has quit smoking. Her smoking use included cigarettes. She has a 3.50 pack-year smoking history. She has never used smokeless tobacco. She reports that she has current or past drug history. Drug: Marijuana. She reports that she does not drink alcohol.     Maternal Diabetes: No Genetic Screening: Normal Maternal Ultrasounds/Referrals: Normal Fetal Ultrasounds or other Referrals:  None Maternal Substance Abuse:  No Significant Maternal Medications:  None Significant Maternal Lab Results:  None Other Comments:  None  Review of Systems  Constitutional: Negative.   HENT: Negative.   Eyes: Negative.   Respiratory: Negative.   Cardiovascular: Negative.   Gastrointestinal: Positive for abdominal pain.  Genitourinary: Negative.   Musculoskeletal: Negative.   Skin: Negative.   Neurological: Negative.   Endo/Heme/Allergies: Negative.   Psychiatric/Behavioral: Negative.    Maternal Medical History:  Reason for admission: Rupture of membranes and contractions.   Contractions: Onset was 1-2 hours ago.   Frequency: regular.   Perceived severity is strong.    Fetal activity: Perceived fetal activity is normal.   Last perceived fetal movement was within the past hour.    Prenatal complications: no prenatal complications Prenatal Complications - Diabetes: none.      Last  menstrual period 05/30/2017, unknown if currently breastfeeding. Maternal Exam:  Uterine Assessment: Contraction strength is firm.  Contraction frequency is regular.   Abdomen: Fetal presentation: vertex  Introitus: Normal vulva. Normal vagina.  Amniotic fluid character: clear.  Pelvis: adequate for delivery.   Cervix: Cervix evaluated by digital exam.     Fetal Exam Fetal Monitor Review: Mode: ultrasound.   Variability: moderate (6-25 bpm).   Pattern: no decelerations.    Fetal State Assessment: Category I - tracings are normal.     Physical Exam  Constitutional: She is oriented to person, place, and time. She appears well-developed and well-nourished.  HENT:  Head: Normocephalic.  Eyes: Pupils are equal, round, and reactive to light.  Neck: Normal range of motion.  Cardiovascular: Normal rate, regular rhythm, normal heart sounds and intact distal pulses.  Respiratory: Effort normal and breath sounds normal.  GI: Soft. Bowel sounds are normal.  Genitourinary: Vagina normal and uterus normal.  Musculoskeletal: Normal range of motion.  Neurological: She is alert and oriented to person, place, and time. She has normal reflexes.  Skin: Skin is warm and dry.  Psychiatric: She has a normal mood and affect. Her behavior is normal. Judgment and thought content normal.    Prenatal labs: ABO, Rh: O/Positive/-- (12/17 0000) Antibody: Negative (12/17 0000) Rubella: Immune (12/17 0000) RPR: Non Reactive (05/14 0850)  HBsAg: Negative (12/17 0000)  HIV: Non Reactive (05/14 0850)  GBS:     Assessment/Plan: GBS neg Active labor Admit SVE 8/90/0   Christy Holden 02/22/2018, 8:34 AM

## 2018-02-22 NOTE — Op Note (Signed)
Please see the brief operative note for surgical details 

## 2018-02-23 ENCOUNTER — Encounter (HOSPITAL_COMMUNITY): Payer: Self-pay

## 2018-02-23 DIAGNOSIS — Z302 Encounter for sterilization: Secondary | ICD-10-CM

## 2018-02-23 LAB — CBC
HEMATOCRIT: 33.2 % — AB (ref 36.0–46.0)
HEMOGLOBIN: 11.4 g/dL — AB (ref 12.0–15.0)
MCH: 31.2 pg (ref 26.0–34.0)
MCHC: 34.3 g/dL (ref 30.0–36.0)
MCV: 91 fL (ref 78.0–100.0)
Platelets: 270 10*3/uL (ref 150–400)
RBC: 3.65 MIL/uL — AB (ref 3.87–5.11)
RDW: 13.8 % (ref 11.5–15.5)
WBC: 11.8 10*3/uL — ABNORMAL HIGH (ref 4.0–10.5)

## 2018-02-23 NOTE — Progress Notes (Signed)
Post Partum Day 1/POD #1  Subjective: no complaints, up ad lib, voiding and tolerating PO  Objective: Blood pressure 108/66, pulse (!) 52, temperature 98 F (36.7 C), temperature source Oral, resp. rate 18, height 5\' 4"  (1.626 m), weight 201 lb (91.2 kg), last menstrual period 05/30/2017, SpO2 100 %, unknown if currently breastfeeding.  Physical Exam:  General: alert, cooperative and no distress Lochia: appropriate Uterine Fundus: firm Incision: healing well, no significant drainage, no dehiscence, no significant erythema, honeycomb c/d/i DVT Evaluation: No evidence of DVT seen on physical exam. Negative Homan's sign. No cords or calf tenderness. No significant calf/ankle edema.  Recent Labs    02/22/18 0821 02/23/18 0542  HGB 12.5 11.4*  HCT 36.9 33.2*    Assessment/Plan: Plan for discharge tomorrow, Breastfeeding and Contraception BTL   LOS: 1 day   Donette LarryMelanie Baylyn Sickles, CNM 02/23/2018, 8:06 AM

## 2018-02-23 NOTE — Lactation Note (Signed)
This note was copied from a baby's chart. Lactation Consultation Note  Patient Name: Christy Holden'NToday's Date: 02/23/2018 Reason for consult: Initial assessment;1st time breastfeeding;Early term 37-38.6wks  P2 mother whose infant is now 5720 hours old.  Mother has an 30 year old but has not breastfed.  Mother stated that breastfeeding has been painful.  I offered to assist with latch and mother accepted.  Mother's breasts are soft and non tender with short shafted nipples bilaterally.  Demonstrated hand expression and mother did a return demonstration but no colostrum drops noted.  Assisted to latch in the football hold on the left breast without difficulty.  Lips were flanged with audible swallows noted.  Mother taught breast compressions during feeds.  Encouraged to feed 8-12 times/24 hours or earlier if baby shows feeding cues.  Continue STS, breast massage and hand expression.  Mother will call for assistance as needed.  Mom made aware of O/P services, breastfeeding support groups, community resources, and our phone # for post-discharge questions. Father present.   Maternal Data Formula Feeding for Exclusion: No Has patient been taught Hand Expression?: Yes Does the patient have breastfeeding experience prior to this delivery?: No  Feeding Feeding Type: Breast Fed Length of feed: 15 min  LATCH Score Latch: Grasps breast easily, tongue down, lips flanged, rhythmical sucking.  Audible Swallowing: A few with stimulation  Type of Nipple: Everted at rest and after stimulation(short shafted bilaterally)  Comfort (Breast/Nipple): Soft / non-tender  Hold (Positioning): Assistance needed to correctly position infant at breast and maintain latch.  LATCH Score: 8  Interventions Interventions: Breast feeding basics reviewed;Assisted with latch;Skin to skin;Breast massage;Hand express;Position options;Support pillows;Adjust position;Breast compression;Shells;Hand pump  Lactation  Tools Discussed/Used Tools: Shells Shell Type: Inverted   Consult Status Consult Status: Follow-up Date: 02/24/18 Follow-up type: In-patient    Ilze Roselli R Judah Carchi 02/23/2018, 5:56 AM

## 2018-02-24 ENCOUNTER — Encounter: Payer: Medicaid Other | Admitting: Obstetrics & Gynecology

## 2018-02-24 NOTE — Discharge Summary (Signed)
OB Discharge Summary     Patient Name: Christy Holden DOB: [birth year] MRN: [id number]  Date of admission: 02/22/2018 Delivering MD: Wyvonnia Dusky D   Date of discharge: 02/24/2018  Admitting diagnosis: WATER BROKE Intrauterine pregnancy: [redacted]w[redacted]d     Secondary diagnosis:  Active Problems:   Normal labor and delivery   SVD (spontaneous vaginal delivery)   Encounter for tubal ligation  Additional problems: none     Discharge diagnosis: Term Pregnancy Delivered                                                                                                Post partum procedures:postpartum tubal ligation  Augmentation: none  Complications: None  Hospital course:  Onset of Labor With Vaginal Delivery     32 y.o. yo [id number] at [redacted]w[redacted]d was admitted in Active Labor on 02/22/2018. Patient had an uncomplicated labor course as follows:  Membrane Rupture Time/Date: 7:45 AM ,02/22/2018   Intrapartum Procedures: Episiotomy: None [1]                                         Lacerations:  None [1]  Patient had a delivery of a Viable infant. 02/22/2018  Information for the patient's newborn:  Teja, Costen [[id number]]  Delivery Method: Vaginal, Spontaneous(Filed from Delivery Summary)    Pateint had an uncomplicated postpartum course.  She is ambulating, tolerating a regular diet, passing flatus, and urinating well. Patient is discharged home in stable condition on 02/24/18.   Physical exam  Vitals:   02/23/18 1100 02/23/18 1616 02/23/18 2235 02/24/18 0539  BP: 123/74 116/71 129/77 (!) 115/59  Pulse: (!) 58 (!) 48 (!) 55 (!) 51  Resp: 18 18  16   Temp: 98.7 F (37.1 C) 98.5 F (36.9 C) 98.9 F (37.2 C) 98.8 F (37.1 C)  TempSrc: Oral Oral Oral Oral  SpO2: 100%   100%  Weight:      Height:       General: alert, cooperative and no distress Lochia: appropriate Uterine Fundus: firm Incision: Dressing is clean, dry, and intact DVT Evaluation: No evidence of DVT seen on physical  exam. No cords or calf tenderness. No significant calf/ankle edema. Labs: Lab Results  Component Value Date   WBC 11.8 (H) 02/23/2018   HGB 11.4 (L) 02/23/2018   HCT 33.2 (L) 02/23/2018   MCV 91.0 02/23/2018   PLT 270 02/23/2018   CMP Latest Ref Rng & Units 02/22/2018  Glucose 65 - 99 mg/dL -  BUN 6 - 20 mg/dL -  Creatinine 1.30 - 8.65 mg/dL 7.84  Sodium 696 - 295 mmol/L -  Potassium 3.5 - 5.1 mmol/L -  Chloride 101 - 111 mmol/L -  CO2 22 - 32 mmol/L -  Calcium 8.9 - 10.3 mg/dL -  Total Protein 6.5 - 8.1 g/dL -  Total Bilirubin 0.3 - 1.2 mg/dL -  Alkaline Phos 38 - 284 U/L -  AST 15 - 41 U/L -  ALT 14 - 54 U/L -  Discharge instruction: per After Visit Summary and "Baby and Me Booklet".  After visit meds:  Allergies as of 02/24/2018   No Known Allergies     Medication List    TAKE these medications   omeprazole 20 MG tablet Commonly known as:  PRILOSEC OTC Take 1 tablet (20 mg total) by mouth daily.   prenatal multivitamin Tabs tablet Take 1 tablet by mouth daily at 12 noon.       Diet: routine diet  Activity: Advance as tolerated. Pelvic rest for 6 weeks.   Outpatient follow up:4 weeks  Follow up Appt:No future appointments. Follow up Visit:No follow-ups on file.  Postpartum contraception: Tubal Ligation  Newborn Data: Live born female  Birth Weight: 7 lb 10.8 oz (3480 g) APGAR: 9, 9  Newborn Delivery   Birth date/time:  02/22/2018 09:24:00 Delivery type:  Vaginal, Spontaneous     Baby Feeding: Bottle and Breast Disposition:home with mother   02/24/2018 Sharyon CableVeronica C Ladarrius Bogdanski, CNM

## 2018-02-24 NOTE — Lactation Note (Signed)
This note was copied from a baby's chart. Lactation Consultation Note  Patient Name: Christy Holden ZOXWR'UToday's Date: 02/24/2018 Reason for consult: Follow-up assessment;1st time breastfeeding;Early term 37-38.6wks   Follow up with mom of 50 hour old infant. Infant with 5 BF for 11-28 minutes, 2 BF attempts, 3 voids and 2 stools in the last 24 hours. Infant weight 7 pounds 3.7 ounces with 6% weight loss since birth. LATCH scores 9.   Mom reports her breasts are fuller today. Mom reports leaking on the opposite breast with feedings. Mom reports nipple pain with latch that improves with feeding, reviewed applying EBM and coconut oil to nipple post feeding.   Reviewed I/O, signs of dehydration in the infant, how to know when your infant is getting enough, engorgement prevention/treatment and breast milk expression and storage. [facility] reviewed, mom aware of OP services, BF Support Groups and LC phone #. Mom to call with questions/concerns as needed.   [facility] Mom has manual pump for home use. Mom reports all questions/concerns have been answered.    Maternal Data Formula Feeding for Exclusion: No Has patient been taught Hand Expression?: Yes Does the patient have breastfeeding experience prior to this delivery?: No  Feeding    LATCH Score                   Interventions    Lactation Tools Discussed/Used WIC Program: No(Plans to apply)   Consult Status Consult Status: Complete Follow-up type: Call as needed    Christy Holden 02/24/2018, 12:05 PM

## 2018-02-24 NOTE — Anesthesia Postprocedure Evaluation (Signed)
Anesthesia Post Note  Patient: Christy Holden  Procedure(s) Performed: POST PARTUM TUBAL LIGATION (Bilateral Abdomen)     Patient location during evaluation: PACU Anesthesia Type: Spinal Level of consciousness: awake and alert Pain management: pain level controlled Vital Signs Assessment: post-procedure vital signs reviewed and stable Respiratory status: spontaneous breathing and respiratory function stable Cardiovascular status: blood pressure returned to baseline and stable Postop Assessment: no headache, no backache, spinal receding and no apparent nausea or vomiting Anesthetic complications: no    Last Vitals:  Vitals:   02/23/18 2235 02/24/18 0539  BP: 129/77 (!) 115/59  Pulse: (!) 55 (!) 51  Resp:  16  Temp: 37.2 C 37.1 C  SpO2:  100%    Last Pain:  Vitals:   02/24/18 0648  TempSrc:   PainSc: 0-No pain   Pain Goal: Patients Stated Pain Goal: 4 (02/22/18 1732)               Phillips Groutarignan, [name]

## 2018-04-02 ENCOUNTER — Encounter: Payer: Self-pay | Admitting: Advanced Practice Midwife

## 2018-04-02 ENCOUNTER — Encounter: Payer: Self-pay | Admitting: Family Medicine

## 2018-04-02 ENCOUNTER — Ambulatory Visit (INDEPENDENT_AMBULATORY_CARE_PROVIDER_SITE_OTHER): Payer: Medicaid Other | Admitting: Advanced Practice Midwife

## 2018-04-02 DIAGNOSIS — Z1389 Encounter for screening for other disorder: Secondary | ICD-10-CM

## 2018-04-02 DIAGNOSIS — Z9851 Tubal ligation status: Secondary | ICD-10-CM

## 2018-04-02 NOTE — Progress Notes (Signed)
Subjective:     Christy Holden is a 32 y.o. female who presents for a postpartum visit. She is 5 weeks postpartum following a spontaneous vaginal delivery. I have fully reviewed the prenatal and intrapartum course. The delivery was at 38.2 gestational weeks. Outcome: spontaneous vaginal delivery. Anesthesia: none. Postpartum course has been unremarkable. Baby's course has been unremarkable. Baby is feeding by both breast and bottle - Gerber. Bleeding no bleeding. Bowel function is normal. Bladder function is normal. Patient is sexually active. Contraception method is tubal ligation. Postpartum depression screening: negative.  The following portions of the patient's history were reviewed and updated as appropriate: allergies, current medications, past family history, past medical history, past social history, past surgical history and problem list.  Review of Systems Pertinent items noted in HPI and remainder of comprehensive ROS otherwise negative.   Objective:    BP 125/78   Pulse 86   Ht 5\' 4"  (1.626 m)   Wt 189 lb (85.7 kg)   LMP 05/30/2017   BMI 32.44 kg/m   VS reviewed, nursing note reviewed,  Constitutional: well developed, well nourished, no distress HEENT: normocephalic CV: normal rate Pulm/chest wall: normal effort Abdomen: soft Neuro: alert and oriented x 3 Skin: warm, dry Psych: affect normal Assessment/Plan:    1. Postpartum care and examination --Doing well, bonding well with baby. Wants to return to work tomorrow, sooner than original [employer]ML[employer] paperwork.  Letter provided.   2. Difficulty of mother performing breastfeeding -Infant latch painful, mother exclusively pumping.  Pumping 3-5 times daily. Discussed latch, ways to improve latch, printed materials given.   --Recommended pt call lactation, bring in infant to evaluate/work on latch.  Until then, pump more frequently to increase supply.  Pt is taking herbal supplement to increase milk supply.  Supplement probably safe  but increased feeding/pumping is best way to increase supply.  Pt states understanding. She has double electric breastpump and will be able to pump at work.  Sharen CounterLisa Leftwich-Kirby, CNM 4:48 PM

## 2018-04-02 NOTE — Patient Instructions (Signed)
Try increased pumping/feeding  And Fenugreek for increased milk supply.  Try lactation cookies too.  Breastfeeding Challenges and Solutions Even though breastfeeding is natural, it can be challenging, especially in the first few weeks after childbirth. It is normal for problems to arise when starting to breastfeed your new baby, even if you have breastfed before. This document provides some solutions to the most common breastfeeding challenges. Challenges and solutions Challenge-Cracked or Sore Nipples Cracked or sore nipples are commonly experienced by breastfeeding mothers. Cracked or sore nipples often are caused by inadequate latching (when your baby's mouth attaches to your breast to breastfeed). Soreness can also happen if your baby is not positioned properly at your breast. Although nipple cracking and soreness are common during the first week after birth, nipple pain is never normal. If you experience nipple cracking or soreness that lasts longer than 1 week or nipple pain, call your health care provider or lactation consultant. Solution Ensure proper latching and positioning of your baby by following the steps below:  Find a comfortable place to sit or lie down, with your neck and back well supported.  Place a pillow or rolled up blanket under your baby to bring him or her to the level of your breast (if you are seated).  Make sure that your baby's abdomen is facing your abdomen.  Gently massage your breast. With your fingertips, massage from your chest wall toward your nipple in a circular motion. This encourages milk flow. You may need to continue this action during the feeding if your milk flows slowly.  Support your breast with 4 fingers underneath and your thumb above your nipple. Make sure your fingers are well away from your nipple and your baby's mouth.  Stroke your baby's lips gently with your finger or nipple.  When your baby's mouth is open wide enough, quickly bring your  baby to your breast, placing your entire nipple and as much of the colored area around your nipple (areola) as possible into your baby's mouth. ? More areola should be visible above your baby's upper lip than below the lower lip. ? Your baby's tongue should be between his or her lower gum and your breast.  Ensure that your baby's mouth is correctly positioned around your nipple (latched). Your baby's lips should create a seal on your breast and be turned out (everted).  It is common for your baby to suck for about 2-3 minutes in order to start the flow of breast milk.  Signs that your baby has successfully latched on to your nipple include:  Quietly tugging or quietly sucking without causing you pain.  Swallowing heard between every 3-4 sucks.  Muscle movement above and in front of his or her ears with sucking.  Signs that your baby has not successfully latched on to nipple include:  Sucking sounds or smacking sounds from your baby while nursing.  Nipple pain.  Ensure that your breasts stay moisturized and healthy by:  Avoiding the use of soap on your nipples.  Wearing a supportive bra. Avoid wearing underwire-style bras or tight bras.  Air drying your nipples for 3-4 minutes after each feeding.  Using only cotton bra pads to absorb breast milk leakage. Leaking of breast milk between feedings is normal. Be sure to change the pads if they become soaked with milk.  Using lanolin on your nipples after nursing. Lanolin helps to maintain your skin's normal moisture barrier. If you use pure lanolin you do not need to wash it off  before feeding your baby again. Pure lanolin is not toxic to your baby. You may also hand express a few drops of breast milk and gently massage that milk into your nipples, allowing it to air dry.  Challenge-Breast Engorgement Breast engorgement is the overfilling of your breasts with breast milk. In the first few weeks after giving birth, you may experience  breast engorgement. Breast engorgement can make your breasts throb and feel hard, tightly stretched, warm, and tender. Engorgement peaks about the fifth day after you give birth. Having breast engorgement does not mean you have to stop breastfeeding your baby. Solution  Breastfeed when you feel the need to reduce the fullness of your breasts or when your baby shows signs of hunger. This is called "breastfeeding on demand."  Newborns (babies younger than 4 weeks) often breastfeed every 1-3 hours during the day. You may need to awaken your baby to feed if he or she is asleep at a feeding time.  Do not allow your baby to sleep longer than 5 hours during the night without a feeding.  Pump or hand express breast milk before breastfeeding to soften your breast, areola, and nipple.  Apply warm, moist heat (in the shower or with warm water-soaked hand towels) just before feeding or pumping, or massage your breast before or during breastfeeding. This increases circulation and helps your milk to flow.  Completely empty your breasts when breastfeeding or pumping. Afterward, wear a snug bra (nursing or regular) or tank top for 1-2 days to signal your body to slightly decrease milk production. Only wear snug bras or tank tops to treat engorgement. Tight bras typically should be avoided by breastfeeding mothers. Once engorgement is relieved, return to wearing regular, loose-fitting clothes.  Apply ice packs to your breasts to lessen the pain from engorgement and relieve swelling, unless the ice is uncomfortable for you.  Do not delay feedings. Try to relax when it is time to feed your baby. This helps to trigger your "let-down reflex," which releases milk from your breast.  Ensure your baby is latched on to your breast and positioned properly while breastfeeding.  Allow your baby to remain at your breast as long as he or she is latched on well and actively sucking. Your baby will let you know when he or she  is done breastfeeding by pulling away from your breast or falling asleep.  Avoid introducing bottles or pacifiers to your baby in the early weeks of breastfeeding. Wait to introduce these things until after resolving any breastfeeding challenges.  Try to pump your milk on the same schedule as when your baby would breastfeed if you are returning to work or away from home for an extended period.  Drink plenty of fluids to avoid dehydration, which can eventually put you at greater risk of breast engorgement.  If you follow these suggestions, your engorgement should improve in 24-48 hours. If you are still experiencing difficulty, call your lactation consultant or health care provider. Challenge-Plugged Milk Ducts Plugged milk ducts occur when the duct does not drain milk effectively and becomes swollen. Wearing a tight-fitting nursing bra or having difficulty with latching may cause plugged milk ducts. Not drinking enough water (8-10 c [1.9-2.4 L] per day) can contribute to plugged milk ducts. Once a duct has become plugged, hard lumps, soreness, and redness may develop in your breast. Solution Do not delay feedings. Feed your baby frequently and try to empty your breasts of milk at each feeding. Try breastfeeding from the  affected side first so there is a better chance that the milk will drain completely from that breast. Apply warm, moist towels to your breasts for 5-10 minutes before feeding. Alternatively, a hot shower right before breastfeeding can provide the moist heat that can encourage milk flow. Gentle massage of the sore area before and during a feeding may also help. Avoid wearing tight clothing or bras that put pressure on your breasts. Wear bras that offer good support to your breasts, but avoid underwire bras. If you have a plugged milk duct and develop a fever, you need to see your health care provider. Challenge-Mastitis Mastitis is inflammation of your breast. It usually is caused by a  bacterial infection and can cause flu-like symptoms. You may develop redness in your breast and a fever. Often when mastitis occurs, your breast becomes firm, warm, and very painful. The most common causes of mastitis are poor latching, ineffective sucking from your baby, consistent pressure on your breast (possibly from wearing a tight-fitting bra or shirt that restricts the milk flow), unusual stress or fatigue, or missed feedings. Solution You will be given antibiotic medicine to treat the infection. It is still important to breastfeed frequently to empty your breasts. Continuing to breastfeed while you recover from mastitis will not harm your baby. Make sure your baby is positioned properly during every feeding. Apply moist heat to your breasts for a few minutes before feeding to help the milk flow and to help your breasts empty more easily. Challenge-Thrush Ginette Pitman is a yeast infection that can form on your nipples, in your breast, or in your baby's mouth. It causes itching, soreness, burning or stabbing pain, and sometimes a rash. Solution You will be given a medicated ointment for your nipples, and your baby will be given a liquid medicine for his or her mouth. It is important that you and your baby are treated at the same time because thrush can be passed between you and your baby. Change disposable nursing pads often. Any bras, towels, or clothing that come in contact with infected areas of your body or your baby's body need to be washed in very hot water every day. Wash your hands and your baby's hands often. All pacifiers, bottle nipples, or toys your baby puts in his or her mouth should be boiled once a day for 20 minutes. After 1 week of treatment, discard pacifiers and bottle nipples and buy new ones. All breast pump parts that touch the milk need to be boiled for 20 minutes every day. Challenge-Low Milk Supply You may not be producing enough milk if your baby is not gaining the proper amount of  weight. Breast milk production is based on a supply-and-demand system. Your milk supply depends on how frequently and effectively your baby empties your breast. Solution The more you breastfeed and pump, the more breast milk you will produce. It is important that your baby empties at least one of your breasts at each feeding. If this is not happening, then use a breast pump or hand express any milk that remains. This will help to drain as much milk as possible at each feeding. It will also signal your body to produce more milk. If your baby is not emptying your breasts, it may be due to latching, sucking, or positioning problems. If low milk supply continues after addressing these issues, contact your health care provider or a lactation specialist as soon as possible. Challenge-Inverted or Flat Nipples Some women have nipples that turn inward  instead of protruding outward. Other women have nipples that are flat. Inverted or flat nipples can sometimes make it more difficult for your baby to latch onto your breast. Solution You may be given a small device that pulls out inverted nipples. This device should be applied right before your baby is brought to your breast. You can also try using a breast pump for a short time before placing the baby at your breast. The pump can pull your nipple outwards to help your infant latch more easily. The baby's sucking motion will help the inverted nipple protrude as well. If you have flat nipples, encourage your baby to latch onto your breast and feed frequently in the early days after birth. This will give your baby practice latching on correctly while your breast is still soft. When your milk supply increases, between the second and fifth day after birth and your breasts become full, your baby will have an easier time latching. Contact a lactation consultant if you still have concerns. She or he can teach you additional techniques to address breastfeeding problems related  to nipple shape and position. Where to find more information: [employer]: www.llli.org This information is not intended to replace advice given to you by your health care provider. Make sure you discuss any questions you have with your health care provider. Document Released: 02/10/2006 Document Revised: 01/31/2016 Document Reviewed: 02/12/2013 Elsevier Interactive Patient Education  2017 [employer]

## 2018-04-06 ENCOUNTER — Encounter (INDEPENDENT_AMBULATORY_CARE_PROVIDER_SITE_OTHER): Payer: Self-pay

## 2018-04-28 ENCOUNTER — Ambulatory Visit: Payer: Medicaid Other | Admitting: Advanced Practice Midwife

## 2018-08-03 IMAGING — US US OB COMP LESS 14 WK
1 series · 15 of 28 positions shown · non-contrast
Comparison: None.

CLINICAL DATA: Spotting.  Cramping for 2 days

EXAM:
OBSTETRIC <14 WK US AND TRANSVAGINAL OB US
TECHNIQUE: Both transabdominal and transvaginal ultrasound examinations were
performed for complete evaluation of the gestation as well as the
maternal uterus, adnexal regions, and pelvic cul-de-sac.
Transvaginal technique was performed to assess early pregnancy.

[Series 1: us ob comp less 14 wk · 15 of 38 slices shown]
[im 1/38]
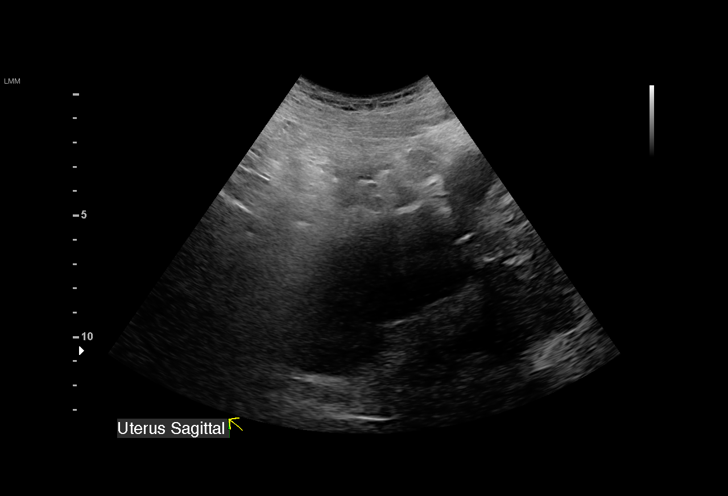
[im 3/38]
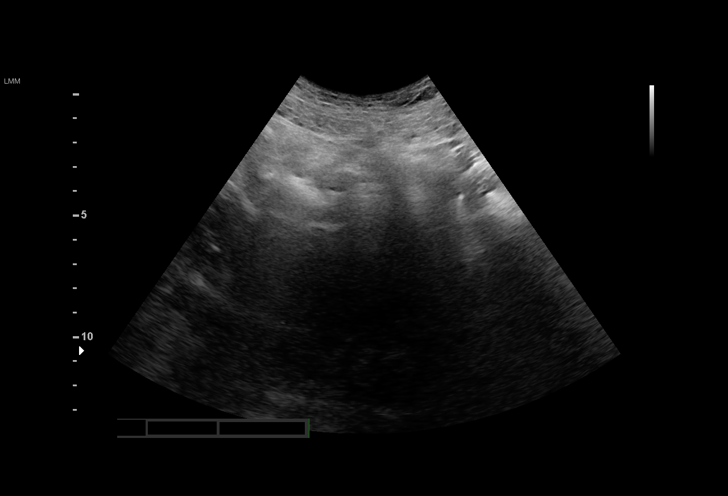
[im 6/38]
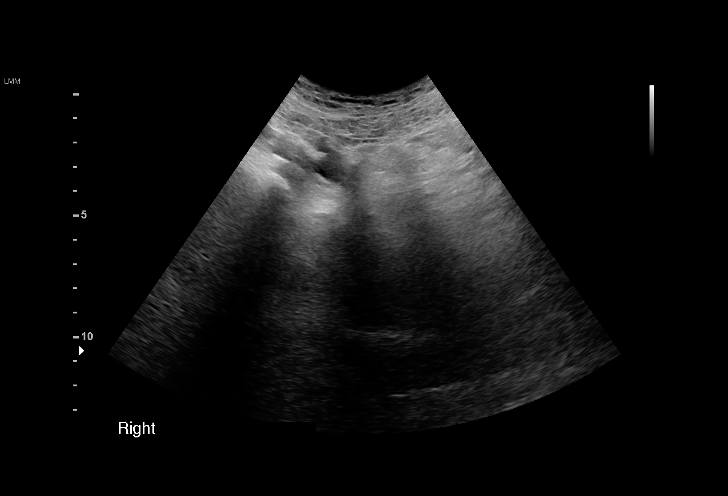
[im 9/38]
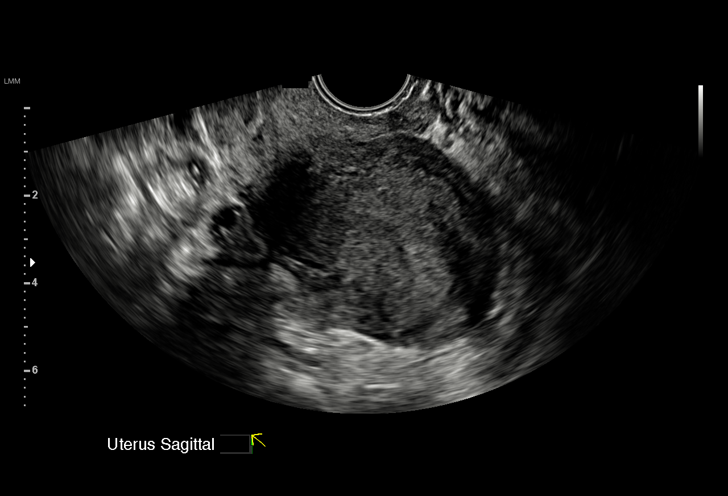
[im 11/38]
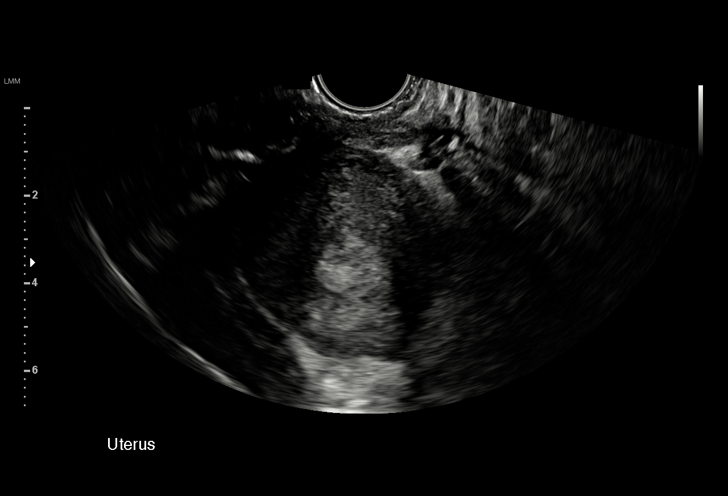
[im 14/38]
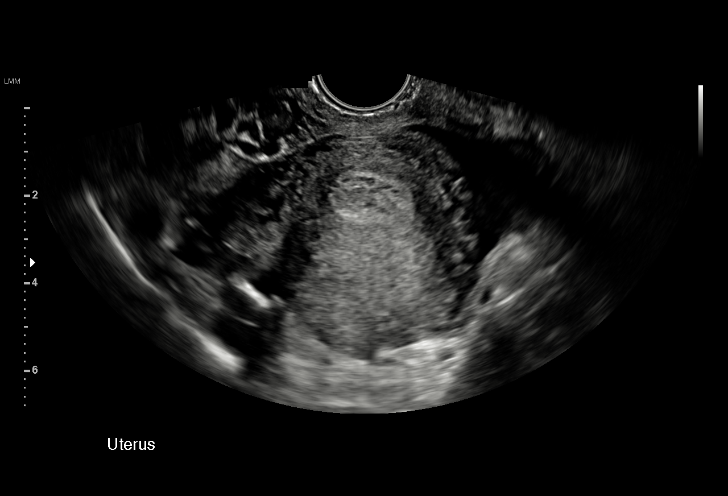
[im 17/38]
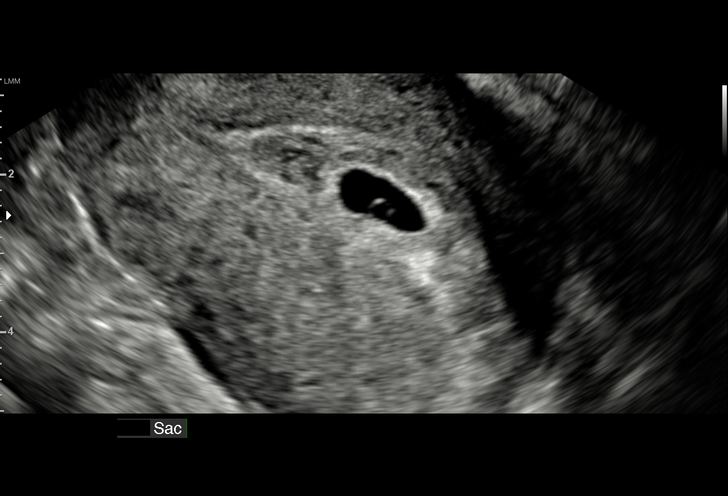
[im 20/38]
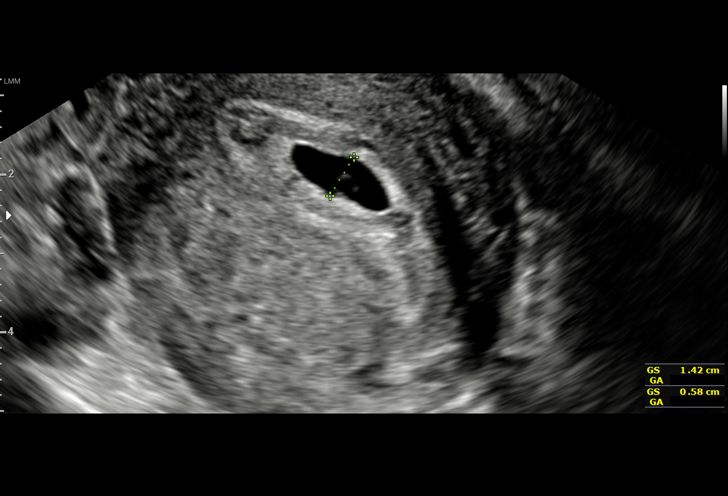
[im 21/38]
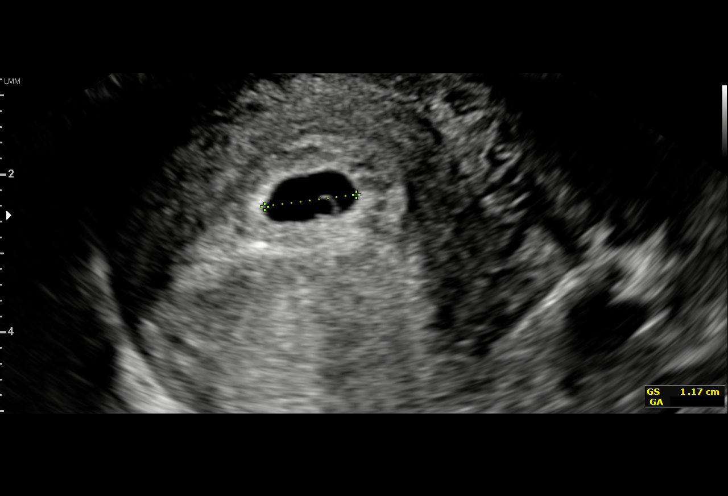
[im 24/38]
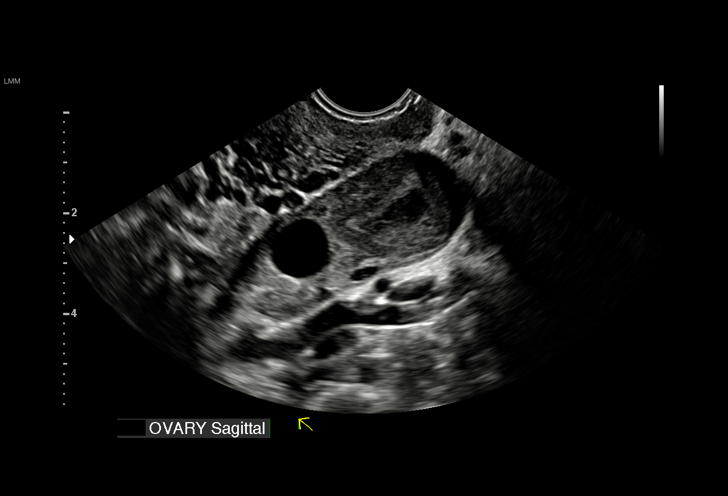
[im 27/38]
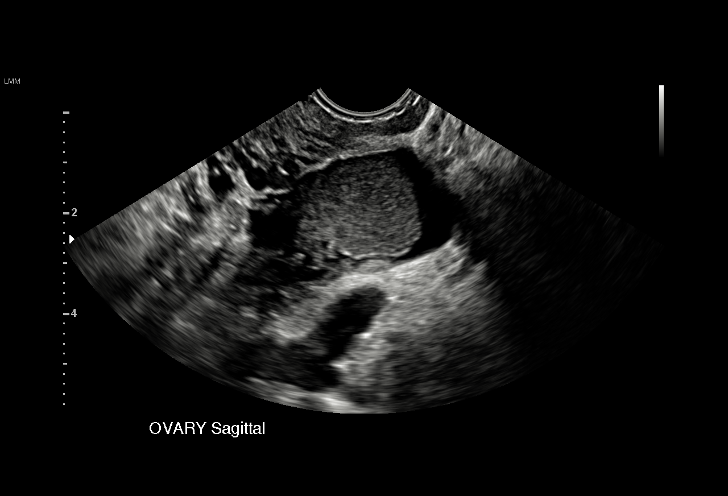
[im 29/38]
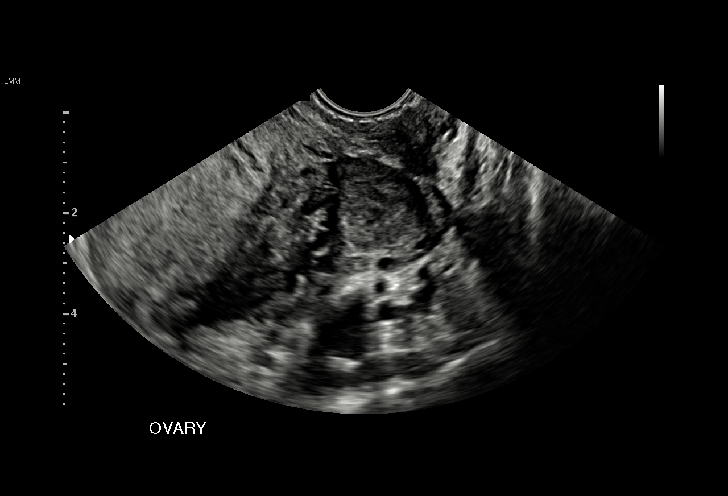
[im 32/38]
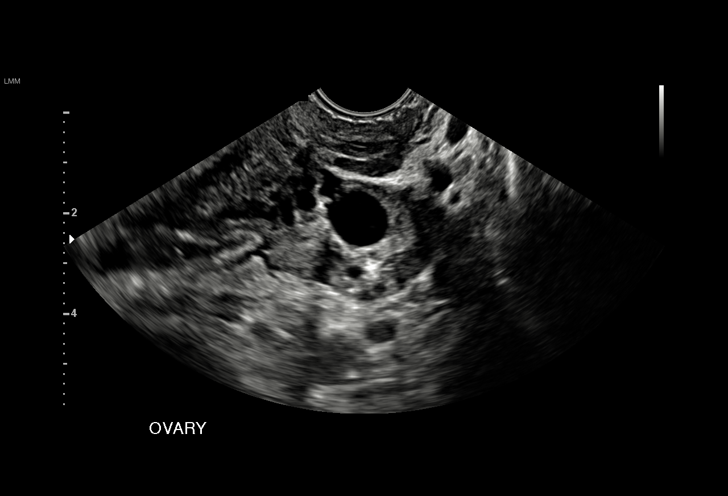
[im 35/38]
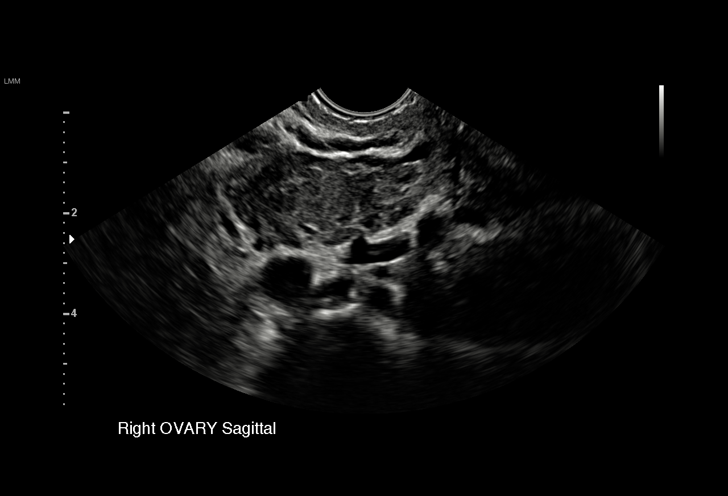
[im 38/38]
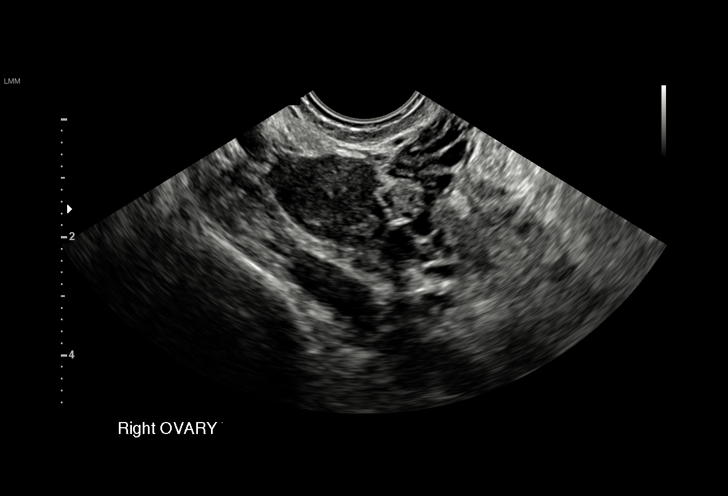

[15 of 28 positions shown; findings below may reference images not displayed]

FINDINGS: Intrauterine gestational sac: Single

Yolk sac:  Visualized.

Embryo:  Not Visualized.

MSD: 9.5  mm   5 w   5  d

Subchorionic hemorrhage:  None visualized.

Maternal uterus/adnexae:

Right ovary: Normal

Left ovary: Normal

Other :None

Free fluid:  None
IMPRESSION: 1. Probable early intrauterine gestational sac containing yolk sac,
but no fetal pole, or cardiac activity yet visualized. Recommend
follow-up quantitative B-HCG levels and follow-up US in 14 days to
confirm and assess viability. This recommendation follows SRU
consensus guidelines: Diagnostic Criteria for Nonviable Pregnancy
Early in the First Trimester. N Engl J Med 6920; [DATE].

## 2018-09-19 ENCOUNTER — Other Ambulatory Visit: Payer: Self-pay

## 2018-09-19 ENCOUNTER — Encounter (HOSPITAL_COMMUNITY): Payer: Self-pay

## 2018-09-19 ENCOUNTER — Ambulatory Visit (HOSPITAL_COMMUNITY)
Admission: EM | Admit: 2018-09-19 | Discharge: 2018-09-19 | Disposition: A | Discharge status: Home / Self Care | Payer: Medicaid Other | Attending: Family Medicine | Admitting: Family Medicine

## 2018-09-19 DIAGNOSIS — M542 Cervicalgia: Secondary | ICD-10-CM

## 2018-09-19 MED ORDER — CYCLOBENZAPRINE HCL 10 MG PO TABS: 10.0000 mg | ORAL_TABLET | Freq: Two times a day (BID) | ORAL | 0 refills | Status: DC | PRN

## 2018-09-19 MED ORDER — NAPROXEN 500 MG PO TABS: 500.0000 mg | ORAL_TABLET | Freq: Two times a day (BID) | ORAL | 0 refills | Status: DC
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
## 2018-12-01 ENCOUNTER — Emergency Department (HOSPITAL_COMMUNITY)
Admission: EM | Admit: 2018-12-01 | Discharge: 2018-12-01 | Disposition: A | Discharge status: Home / Self Care | Payer: Medicaid Other | Attending: Emergency Medicine | Admitting: Emergency Medicine

## 2018-12-01 ENCOUNTER — Encounter (HOSPITAL_COMMUNITY): Payer: Self-pay | Admitting: Emergency Medicine

## 2018-12-01 DIAGNOSIS — L02412 Cutaneous abscess of left axilla: Secondary | ICD-10-CM | POA: Insufficient documentation

## 2018-12-01 DIAGNOSIS — Z79899 Other long term (current) drug therapy: Secondary | ICD-10-CM | POA: Insufficient documentation

## 2018-12-01 DIAGNOSIS — R2232 Localized swelling, mass and lump, left upper limb: Secondary | ICD-10-CM | POA: Diagnosis present

## 2018-12-01 HISTORY — DX: Hidradenitis suppurativa: L73.2

## 2018-12-01 MED ORDER — SULFAMETHOXAZOLE-TRIMETHOPRIM 800-160 MG PO TABS: 1.0000 | ORAL_TABLET | Freq: Two times a day (BID) | ORAL | 0 refills | Status: AC

## 2018-12-01 MED ORDER — LIDOCAINE-EPINEPHRINE (PF) 2 %-1:200000 IJ SOLN
10.0000 mL | Freq: Once | INTRAMUSCULAR | Status: AC
  Administered 2018-12-01: 10 mL
  Filled 2018-12-01: qty 20

## 2018-12-01 NOTE — ED Triage Notes (Signed)
Pt c/o abscess to L axillary area. Pt denies fever, no drainage.

## 2018-12-01 NOTE — ED Provider Notes (Signed)
[facility] EMERGENCY DEPARTMENT Provider Note   CSN: [id number] Arrival date & time: 12/01/18  7544    History   Chief Complaint Chief Complaint  Patient presents with  . Abscess    HPI Christy Holden is a 33 y.o. female.     Patient with history of abscess presents with complaint of left axillary abscess 2 to 3 days ago.  Area has become more swollen and painful despite warm compresses at home.  No fevers, nausea or vomiting.  Patient has a history of similar requiring incision and drainage.  Onset of symptoms acute.  Course is constant.     Past Medical History:  Diagnosis Date  . Chlamydia   . Hydradenitis   . Migraine headache 07/11/2013  . Vaginal Pap smear, abnormal     Patient Active Problem List   Diagnosis Date Noted  . H/O tubal ligation 04/02/2018  . 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 Obesity, unspecified 06/06/2013    Past Surgical History:  Procedure Laterality Date  . NO PAST SURGERIES    . TUBAL LIGATION Bilateral 02/22/2018   Procedure: POST PARTUM TUBAL LIGATION;  Surgeon: Tilda Burrow, MD;  Location: [facility] BIRTHING SUITES;  Service: Gynecology;  Laterality: Bilateral;     OB History    Gravida  4   Para  2   Term  2   Preterm      AB  2   Living  2     SAB      TAB  2   Ectopic      Multiple  0   Live Births  2            Home Medications    Prior to Admission medications   Medication Sig Start Date End Date Taking? Authorizing Provider  cyclobenzaprine (FLEXERIL) 10 MG tablet Take 1 tablet (10 mg total) by mouth 2 (two) times daily as needed for muscle spasms. 09/19/18   Dahlia Byes A, NP  naproxen (NAPROSYN) 500 MG tablet Take 1 tablet (500 mg total) by mouth 2 (two) times daily. 09/19/18   Dahlia Byes A, NP  omeprazole (PRILOSEC OTC) 20 MG tablet Take 1 tablet (20 mg total) by mouth daily. Patient not taking: Reported on 09/19/2018 02/10/18   Reva Bores, MD   Prenatal Vit-Fe Fumarate-FA (PRENATAL MULTIVITAMIN) TABS tablet Take 1 tablet by mouth daily at 12 noon.    [provider]    Family History Family History  Problem Relation Age of Onset  . Hypertension Mother   . Hypertension Father   . Kidney disease Maternal Grandmother     Social History Social History   Tobacco Use  . Smoking status: Current Every Day Smoker    Packs/day: 0.50    Years: 7.00    Pack years: 3.50    Types: Cigarettes  . Smokeless tobacco: Never Used  . Tobacco comment: unable to smoke for past week r/t n/v  Substance Use Topics  . Alcohol use: Yes    Alcohol/week: 0.0 standard drinks    Frequency: Never    Comment: occasionally   . Drug use: Not Currently    Types: Marijuana    Comment: not currently     Allergies   Patient has no known allergies.   Review of Systems Review of Systems  Constitutional: Negative for fever.  Gastrointestinal: Negative for nausea and vomiting.  Skin: Negative for color change.  Positive for abscess.  Hematological: Negative for adenopathy.     Physical Exam Updated Vital Signs BP 127/77 (BP Location: Right Arm)   Pulse 87   Temp 98.4 F (36.9 C) (Oral)   Resp 18   Ht  (1.626 m)   Wt 99.8 kg   LMP 11/25/2018   SpO2 99%   BMI 37.76 kg/m   Physical Exam Vitals signs and nursing note reviewed.  Constitutional:      Appearance: She is well-developed.  HENT:     Head: Normocephalic and atraumatic.  Eyes:     Conjunctiva/sclera: Conjunctivae normal.  Neck:     Musculoskeletal: Normal range of motion and neck supple.  Pulmonary:     Effort: No respiratory distress.  Musculoskeletal:     Comments: 3 cm area of induration and tenderness consistent with abscess  Skin:    General: Skin is warm and dry.  Neurological:     Mental Status: She is alert.      ED Treatments / Results  Labs (all labs ordered are listed, but only abnormal results are displayed) Labs Reviewed - No  data to display  EKG None  Radiology No results found.  Procedures .Marland KitchenIncision and Drainage Date/Time: 12/01/2018 8:35 AM Performed by: Renne Crigler, PA-C Authorized by: Renne Crigler, PA-C   Consent:    Consent obtained:  Verbal   Consent given by:  Patient   Risks discussed:  Incomplete drainage, bleeding, pain and infection   Alternatives discussed:  No treatment Location:    Type:  Abscess   Location: L axilla. Pre-procedure details:    Skin preparation:  Betadine Anesthesia (see MAR for exact dosages):    Anesthesia method:  Local infiltration   Local anesthetic:  Lidocaine 2% WITH epi Procedure details:    Needle aspiration: no     Incision types:  Stab incision   Scalpel blade:  11   Wound management:  Probed and deloculated   Drainage:  Purulent and bloody   Drainage amount:  Scant   Packing materials:  None Post-procedure details:    Patient tolerance of procedure:  Tolerated well, no immediate complications   (including critical care time)  Medications Ordered in ED Medications  lidocaine-EPINEPHrine (XYLOCAINE W/EPI) 2 %-1:200000 (PF) injection 10 mL (10 mLs Infiltration Given 12/01/18 1610)     Initial Impression / Assessment and Plan / ED Course  I have reviewed the triage vital signs and the nursing notes.  Pertinent labs & imaging results that were available during my care of the patient were reviewed by me and considered in my medical decision making (see chart for details).        Patient seen and examined.  Discussed incision and drainage procedure with patient.  She agrees to proceed.  Vital signs reviewed and are as follows: BP 124/76   Pulse 72   Temp 98.4 F (36.9 C) (Oral)   Resp 18   Ht  (1.626 m)   Wt 99.8 kg   LMP 11/25/2018   SpO2 100%   BMI 37.76 kg/m   Procedure as above.  Minimal purulent drainage noted.  After procedure, ultrasound used to ensure that pocket was adequately targeted.  Incision is over the center  of the abscess pocket.  No satellite collections noted.  Patient will continue warm compresses, prescription for Bactrim written.  The patient was urged to return to the Emergency Department urgently with worsening pain, swelling, expanding erythema especially if it streaks away from the affected  area, fever, or if they have any other concerns.   The patient was urged to return to the Emergency Department urgently with worsening pain, swelling, expanding erythema especially if it streaks away from the affected area, fever, or if they have any other concerns.   The patient was urged to return to the Emergency Department or go to their PCP in 48 hours for wound recheck if the area is not significantly improved.  The patient verbalized understanding and stated agreement with this plan.     Final Clinical Impressions(s) / ED Diagnoses   Final diagnoses:  Abscess of axilla, left   Patient with left axillary abscess.  I&D performed.  Upper extremity neurovascular intact.   ED Discharge Orders         Ordered    sulfamethoxazole-trimethoprim (BACTRIM DS,SEPTRA DS) 800-160 MG tablet  2 times daily     12/01/18 0818           Renne Crigler, PA-C 12/01/18 3612    Gerhard Munch, MD 12/01/18 [phone]

## 2018-12-01 NOTE — Discharge Instructions (Signed)
Please read and follow all provided instructions.  Your diagnoses today include:  1. Abscess of axilla, left     Tests performed today include:  Vital signs. See below for your results today.   Medications prescribed:   Bactrim (trimethoprim/sulfamethoxazole) - antibiotic  You have been prescribed an antibiotic medicine: take the entire course of medicine even if you are feeling better. Stopping early can cause the antibiotic not to work.  Take any prescribed medications only as directed.   Home care instructions:   Follow any educational materials contained in this packet  Follow-up instructions: Return to the Emergency Department in 48 hours for a recheck if your symptoms are not significantly improved.  Please follow-up with your primary care provider in the next 1 week for further evaluation of your symptoms.   Return instructions:  Return to the Emergency Department if you have:  Fever  Worsening symptoms  Worsening pain  Worsening swelling  Redness of the skin that moves away from the affected area, especially if it streaks away from the affected area   Any other emergent concerns  Your vital signs today were: BP 127/77 (BP Location: Right Arm)    Pulse 87    Temp 98.4 F (36.9 C) (Oral)    Resp 18    Ht 5\' 4"  (1.626 m)    Wt 99.8 kg    LMP 11/25/2018    SpO2 99%    BMI 37.76 kg/m  If your blood pressure (BP) was elevated above 135/85 this visit, please have this repeated by your doctor within one month. --------------

## 2018-12-01 NOTE — ED Notes (Signed)
Pt states she understands instruction. Home stable with steady gait.

## 2018-12-03 ENCOUNTER — Emergency Department (HOSPITAL_COMMUNITY)
Admission: EM | Admit: 2018-12-03 | Discharge: 2018-12-03 | Disposition: A | Discharge status: Home / Self Care | Payer: Medicaid Other | Attending: Emergency Medicine | Admitting: Emergency Medicine

## 2018-12-03 ENCOUNTER — Encounter (HOSPITAL_COMMUNITY): Payer: Self-pay | Admitting: Emergency Medicine

## 2018-12-03 ENCOUNTER — Other Ambulatory Visit: Payer: Self-pay

## 2018-12-03 DIAGNOSIS — F17210 Nicotine dependence, cigarettes, uncomplicated: Secondary | ICD-10-CM | POA: Diagnosis not present

## 2018-12-03 DIAGNOSIS — L02412 Cutaneous abscess of left axilla: Secondary | ICD-10-CM | POA: Insufficient documentation

## 2018-12-03 DIAGNOSIS — L0291 Cutaneous abscess, unspecified: Secondary | ICD-10-CM

## 2018-12-03 MED ORDER — LIDOCAINE-EPINEPHRINE (PF) 2 %-1:200000 IJ SOLN
20.0000 mL | Freq: Once | INTRAMUSCULAR | Status: AC
  Administered 2018-12-03: 20 mL
  Filled 2018-12-03: qty 20

## 2018-12-03 MED ORDER — IBUPROFEN 800 MG PO TABS
800.0000 mg | ORAL_TABLET | Freq: Once | ORAL | Status: AC
  Administered 2018-12-03: 10:00:00 800 mg via ORAL
  Filled 2018-12-03: qty 1

## 2018-12-03 MED ORDER — OXYCODONE-ACETAMINOPHEN 5-325 MG PO TABS: 1.0000 | ORAL_TABLET | Freq: Four times a day (QID) | ORAL | 0 refills | Status: DC | PRN

## 2018-12-03 MED ORDER — OXYCODONE-ACETAMINOPHEN 5-325 MG PO TABS
1.0000 | ORAL_TABLET | Freq: Once | ORAL | Status: AC
  Administered 2018-12-03: 1 via ORAL
  Filled 2018-12-03: qty 1

## 2018-12-03 NOTE — ED Triage Notes (Signed)
Pt had L axilla abscess drained 2 days ago- on antibiotics today pain with increased swelling- redness extends into left breast.

## 2018-12-03 NOTE — Discharge Instructions (Addendum)
Please read attached information. If you experience any new or worsening signs or symptoms please return to the emergency room for evaluation. Please follow-up with your primary care provider or specialist as discussed. Please use medication prescribed only as directed and discontinue taking if you have any concerning signs or symptoms.   °

## 2018-12-03 NOTE — ED Provider Notes (Signed)
[facility] EMERGENCY DEPARTMENT Provider Note   CSN: [id number] Arrival date & time: 12/03/18  1275    History   Chief Complaint Chief Complaint  Patient presents with   Abscess    HPI Christy Holden is a 33 y.o. female.     HPI    33 year old female presents today with complaints of abscess.  Patient notes symptoms started approximately 5 days ago with swelling and pain to the left axilla.  She notes this progressed.  She was seen in the emergency room where she underwent I&D.  Patient notes continued pain and swelling at the axilla with difficulty raising her left arm secondary to discomfort.  She notes she has been taking antibiotics, but has not had improvement in her symptoms.  Patient denies any fevers.   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 Authorizing Provider  cyclobenzaprine (FLEXERIL) 10 MG tablet Take 1 tablet (10 mg total) by mouth 2 (two) times daily as needed for muscle spasms. 09/19/18   Dahlia Byes A, NP  naproxen (NAPROSYN) 500 MG tablet Take 1 tablet (500 mg total) by mouth 2 (two) times daily. 09/19/18   Dahlia Byes A, NP  omeprazole (PRILOSEC OTC) 20 MG tablet Take 1 tablet (20 mg total) by mouth daily. Patient not taking: Reported on 09/19/2018 02/10/18   Reva Bores, MD  oxyCODONE-acetaminophen (PERCOCET/ROXICET) 5-325 MG tablet Take 1 tablet by mouth every 6 (six) hours as needed. 12/03/18   Jaya Lapka, Tinnie Gens, PA-C  Prenatal Vit-Fe Fumarate-FA (PRENATAL MULTIVITAMIN) TABS tablet Take 1 tablet by mouth daily at 12 noon.    [provider]  sulfamethoxazole-trimethoprim (BACTRIM DS,SEPTRA DS) 800-160 MG tablet Take 1 tablet by mouth 2 (two) times daily for 7 days. 12/01/18 12/08/18  Renne Crigler, PA-C    Family History Family History  Problem Relation Age of Onset   Hypertension Mother    Hypertension Father    Kidney disease Maternal Grandmother     Social History Social History   Tobacco Use   Smoking status: Current Every Day Smoker    Packs/day: 0.50    Years: 7.00    Pack years: 3.50    Types: Cigarettes   Smokeless tobacco: Never Used   Tobacco comment: unable to smoke for past week r/t n/v  Substance Use Topics   Alcohol use:  Yes    Alcohol/week: 0.0 standard drinks    Frequency: Never    Comment: occasionally    Drug use: Not Currently    Types: Marijuana    Comment: not currently     Allergies   Patient has no known allergies.   Review of Systems Review of Systems  All other systems reviewed and are negative.   Physical Exam Updated Vital Signs BP 137/88 (BP Location: Right Arm)    Pulse 77    Temp 99.5 F (37.5 C) (Oral)    Resp 16    LMP 11/25/2018    SpO2 98%   Physical Exam Vitals signs and nursing note reviewed.  Constitutional:      Appearance: She is well-developed.  HENT:     Head: Normocephalic and atraumatic.  Eyes:     General: No scleral icterus.       Right eye: No discharge.        Left eye: No discharge.     Conjunctiva/sclera: Conjunctivae normal.     Pupils: Pupils are equal, round, and reactive to light.  Neck:      Musculoskeletal: Normal range of motion.     Vascular: No JVD.     Trachea: No tracheal deviation.  Pulmonary:     Effort: Pulmonary effort is normal.     Breath sounds: No stridor.  Musculoskeletal:     Comments: 3 cm area induration of the left axilla central fluctuance noted minor surrounding erythema-pain with abduction of the left shoulder  Neurological:     Mental Status: She is alert and oriented to person, place, and time.     Coordination: Coordination normal.  Psychiatric:        Behavior: Behavior normal.        Thought Content: Thought content normal.        Judgment: Judgment normal.      ED Treatments / Results  Labs (all labs ordered are listed, but only abnormal results are displayed) Labs Reviewed - No data to display  EKG None  Radiology No results found.  Procedures .Marland KitchenIncision and Drainage Date/Time: 12/03/2018 11:05 AM Performed by: Eyvonne Mechanic, PA-C Authorized by: Eyvonne Mechanic, PA-C   Consent:    Consent obtained:  Verbal   Consent given by:  Patient   Risks discussed:  Bleeding, incomplete drainage, damage to other organs, infection and pain   Alternatives discussed:  No treatment and alternative treatment Location:    Type:  Abscess   Size:  3 Pre-procedure details:    Skin preparation:  Chloraprep Anesthesia (see MAR for exact dosages):    Anesthesia method:  Local infiltration   Local anesthetic:  Lidocaine 2% WITH epi Procedure type:    Complexity:  Simple Procedure details:    Needle aspiration: no     Incision types:  Single straight   Incision depth:  Dermal   Scalpel blade:  11   Wound management:  Probed and deloculated   Drainage:  Purulent   Drainage amount:  Copious   Wound treatment:  Wound left open   Packing materials:  None Post-procedure details:    Patient tolerance of procedure:  Tolerated well, no immediate complications   (including critical care time)  EMERGENCY DEPARTMENT US SOFT TISSUE  INTERPRETATION "Study: Limited Soft Tissue Ultrasound"  INDICATIONS: Soft tissue infection Multiple views of the body part were obtained in real-time with a multi-frequency linear probe  PERFORMED BY: Myself IMAGES ARCHIVED?: Yes SIDE:Left BODY PART:Axilla INTERPRETATION:  Abcess present  and Cellulitis present    Medications Ordered in ED Medications  oxyCODONE-acetaminophen (PERCOCET/ROXICET) 5-325 MG per tablet 1 tablet (1 tablet Oral Given 12/03/18 0948)  ibuprofen (ADVIL,MOTRIN) tablet 800 mg (800 mg Oral Given 12/03/18 0948)  lidocaine-EPINEPHrine (XYLOCAINE W/EPI) 2 %-1:200000 (PF) injection 20 mL (20 mLs Infiltration Given 12/03/18 1054)     Initial Impression / Assessment and Plan / ED Course  I have reviewed the triage vital signs and the nursing notes.  Pertinent labs & imaging results that were available during my care of the patient were reviewed by me and considered in my medical decision making (see chart for details).        Labs:   Imaging:  Consults:  Therapeutics: Percocet, lidocaine  Discharge Meds: Percocet  Assessment/Plan: 33 year old female presents today for recheck of her abscess.  The abscess has formed to drainable collection.  Ultrasound shows fluid, I&D successful.  Patient tolerated procedure well.  She will continue using Bactrim follow-up immediately with any new or worsening signs or symptoms.  She verbalized understanding and agreement to today's plan had no further questions or concerns.     Final Clinical Impressions(s) / ED Diagnoses   Final diagnoses:  Abscess    ED Discharge Orders         Ordered    oxyCODONE-acetaminophen (PERCOCET/ROXICET) 5-325 MG tablet  Every 6 hours PRN     12/03/18 1052           Eyvonne Mechanic, PA-C 12/03/18 1108    Alvira Monday, MD 12/12/18 [phone]

## 2019-05-24 ENCOUNTER — Telehealth: Payer: Medicaid Other | Admitting: Physician Assistant

## 2019-05-24 DIAGNOSIS — R059 Cough, unspecified: Secondary | ICD-10-CM

## 2019-05-24 DIAGNOSIS — Z20828 Contact with and (suspected) exposure to other viral communicable diseases: Secondary | ICD-10-CM

## 2019-05-24 DIAGNOSIS — Z20822 Contact with and (suspected) exposure to covid-19: Secondary | ICD-10-CM

## 2019-05-24 DIAGNOSIS — R05 Cough: Secondary | ICD-10-CM

## 2019-05-24 MED ORDER — BENZONATATE 100 MG PO CAPS: 100.0000 mg | ORAL_CAPSULE | Freq: Three times a day (TID) | ORAL | 0 refills | Status: DC | PRN

## 2019-05-24 NOTE — Progress Notes (Signed)
E-Visit for Corona Virus Screening   Your current symptoms could be consistent with the coronavirus.  Many health care providers can now test patients at their office but not all are.  [facility] has multiple testing sites. For information on our COVID testing locations and hours go to [email or web address]  Please quarantine yourself while awaiting your test results.  We are enrolling you in our MyChart Home Montioring for COVID19 . Daily you will receive a questionnaire within the MyChart website. Our COVID 19 response team willl be monitoriing your responses daily.    COVID-19 is a respiratory illness with symptoms that are similar to the flu. Symptoms are typically mild to moderate, but there have been cases of severe illness and death due to the virus. The following symptoms may appear 2-14 days after exposure: . Fever . Cough . Shortness of breath or difficulty breathing . Chills . Repeated shaking with chills . Muscle pain . Headache . Sore throat . New loss of taste or smell . Fatigue . Congestion or runny nose . Nausea or vomiting . Diarrhea  It is vitally important that if you feel that you have an infection such as this virus or any other virus that you stay home and away from places where you may spread it to others.  You should self-quarantine for 14 days if you have symptoms that could potentially be coronavirus or have been in close contact a with a person diagnosed with COVID-19 within the last 2 weeks. You should avoid contact with people age 65 and older.   You should wear a mask or cloth face covering over your nose and mouth if you must be around other people or animals, including pets (even at home). Try to stay at least 6 feet away from other people. This will protect the people around you.  You can use medication such as A prescription cough medication called Tessalon Perles 100 mg. You may take 1-2 capsules every 8 hours as needed for  cough  You may also take acetaminophen (Tylenol) as needed for fever.   Reduce your risk of any infection by using the same precautions used for avoiding the common cold or flu:  . Wash your hands often with soap and warm water for at least 20 seconds.  If soap and water are not readily available, use an alcohol-based hand sanitizer with at least 60% alcohol.  . If coughing or sneezing, cover your mouth and nose by coughing or sneezing into the elbow areas of your shirt or coat, into a tissue or into your sleeve (not your hands). . Avoid shaking hands with others and consider head nods or verbal greetings only. . Avoid touching your eyes, nose, or mouth with unwashed hands.  . Avoid close contact with people who are sick. . Avoid places or events with large numbers of people in one location, like concerts or sporting events. . Carefully consider travel plans you have or are making. . If you are planning any travel outside or inside the [place], visit the CDC's Travelers' Health webpage for the latest health notices. . If you have some symptoms but not all symptoms, continue to monitor at home and seek medical attention if your symptoms worsen. . If you are having a medical emergency, call 911.  HOME CARE . Only take medications as instructed by your medical team. . Drink plenty of fluids and get plenty of rest. . A steam or ultrasonic humidifier can help if you have congestion.     GET HELP RIGHT AWAY IF YOU HAVE EMERGENCY WARNING SIGNS** FOR COVID-19. If you or someone is showing any of these signs seek emergency medical care immediately. Call 911 or proceed to your closest emergency facility if: . You develop worsening high fever. . Trouble breathing . Bluish lips or face . Persistent pain or pressure in the chest . New confusion . Inability to wake or stay awake . You cough up blood. . Your symptoms become more severe  **This list is not all possible symptoms. Contact your medical  provider for any symptoms that are sever or concerning to you.   MAKE SURE YOU   Understand these instructions.  Will watch your condition.  Will get help right away if you are not doing well or get worse.  Your e-visit answers were reviewed by a board certified advanced clinical practitioner to complete your personal care plan.  Depending on the condition, your plan could have included both over the counter or prescription medications.  If there is a problem please reply once you have received a response from your provider.  Your safety is important to us.  If you have drug allergies check your prescription carefully.    You can use MyChart to ask questions about today's visit, request a non-urgent call back, or ask for a work or school excuse for 24 hours related to this e-Visit. If it has been greater than 24 hours you will need to follow up with your provider, or enter a new e-Visit to address those concerns. You will get an e-mail in the next two days asking about your experience.  I hope that your e-visit has been valuable and will speed your recovery. Thank you for using e-visits.   Greater than 5 minutes, yet less than 10 minutes of time have been spent researching, coordinating and implementing care for this patient today.   

## 2019-06-11 IMAGING — US US MFM OB TRANSVAGINAL
1 series · 15 of 28 positions shown · non-contrast
Comparison: none

[Series 1: us mfm ob transvaginal · 15 of 40 slices shown]
[im 1/40]
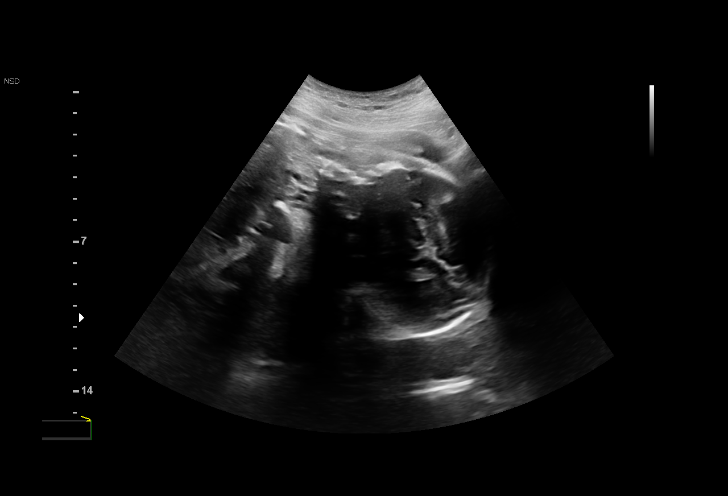
[im 3/40]
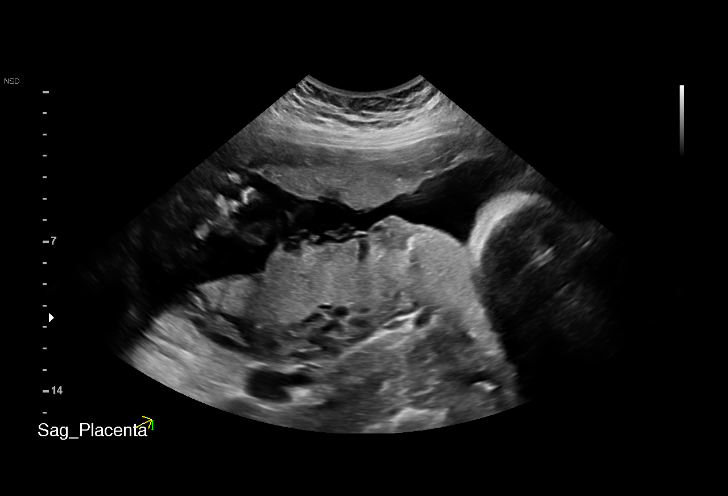
[im 6/40]
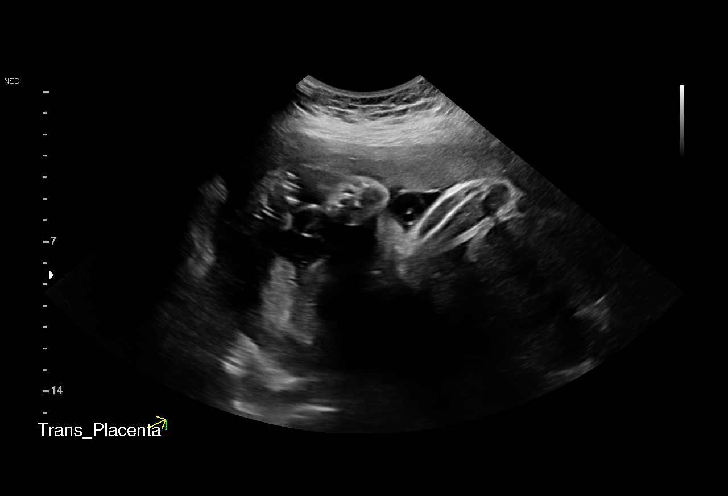
[im 9/40]
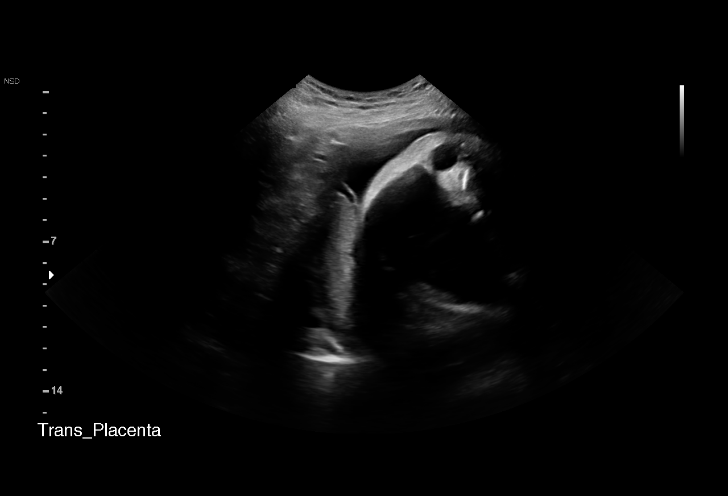
[im 12/40]
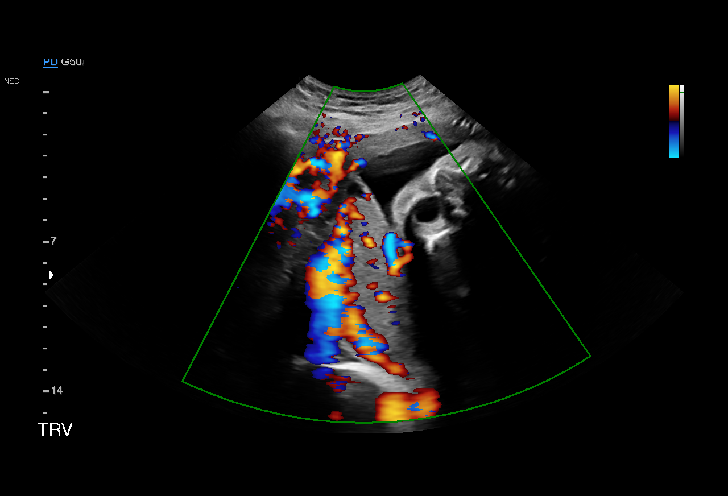
[im 15/40]
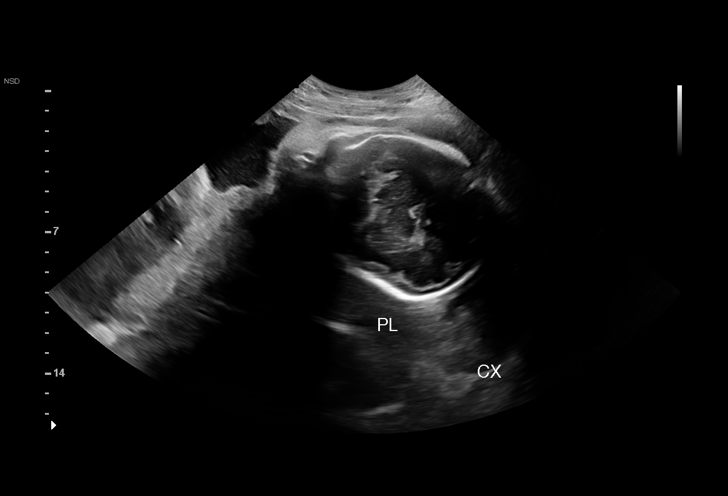
[im 18/40]
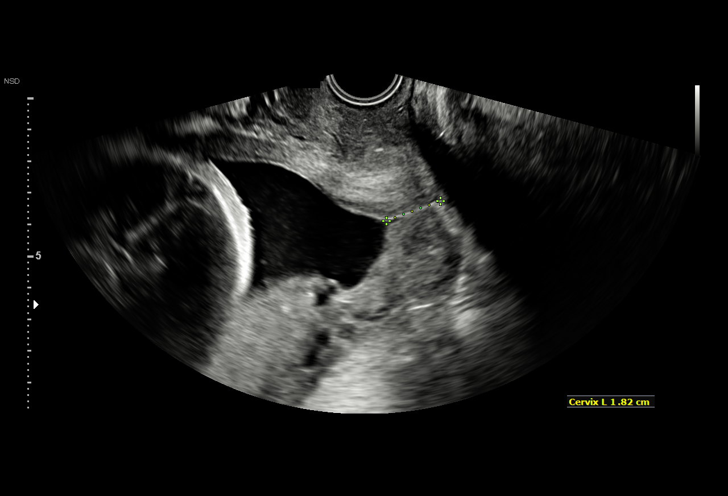
[im 21/40]
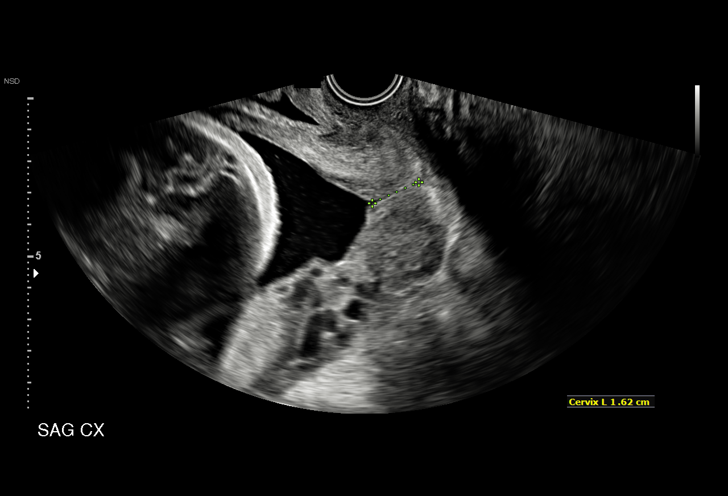
[im 22/40]
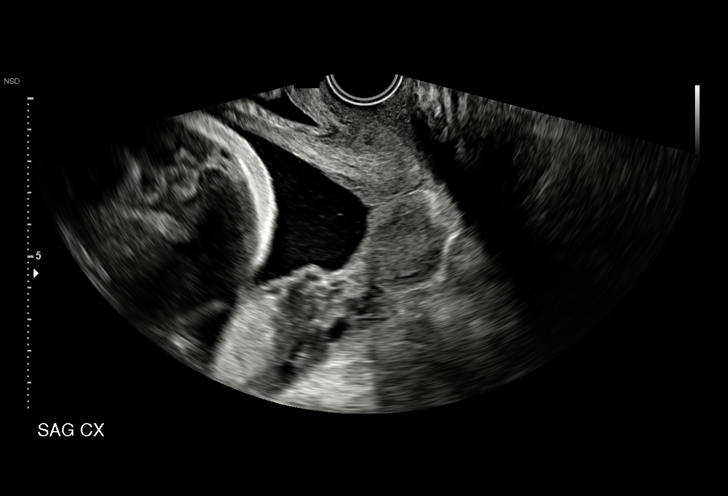
[im 25/40]
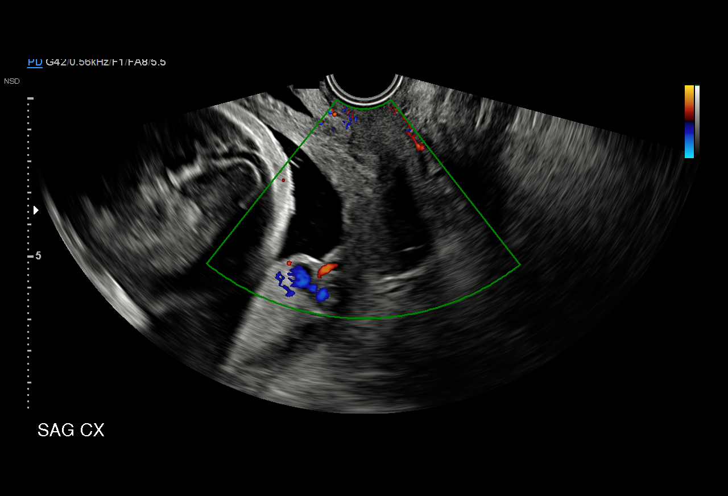
[im 28/40]
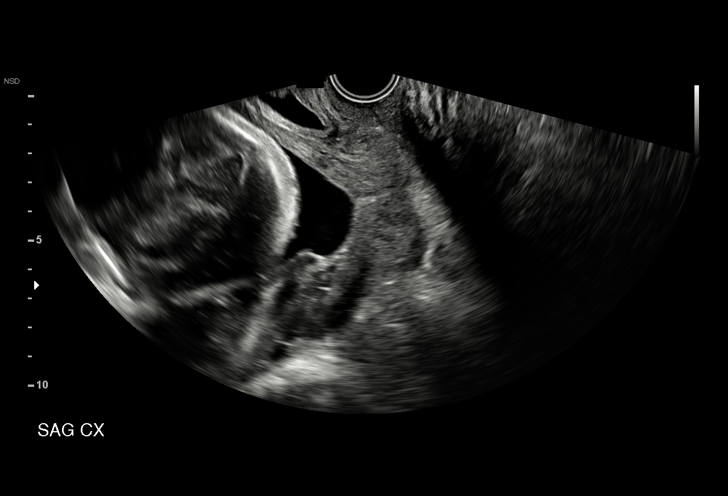
[im 31/40]
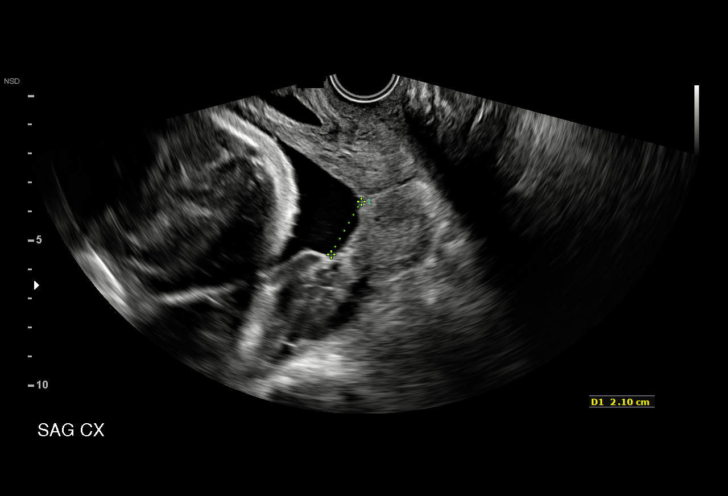
[im 34/40]
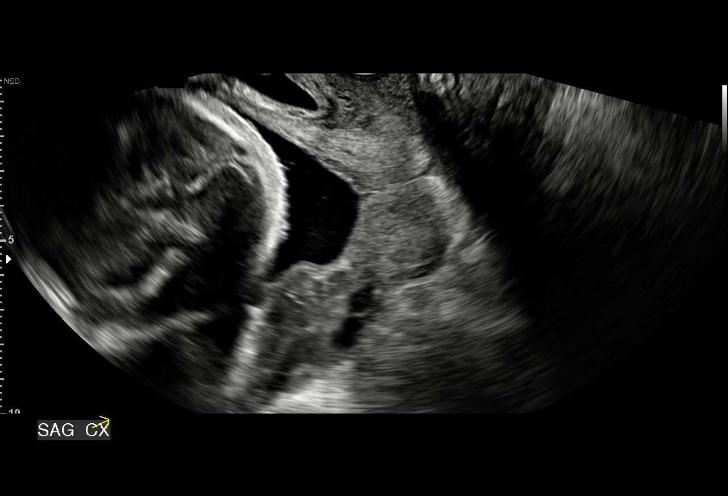
[im 37/40]
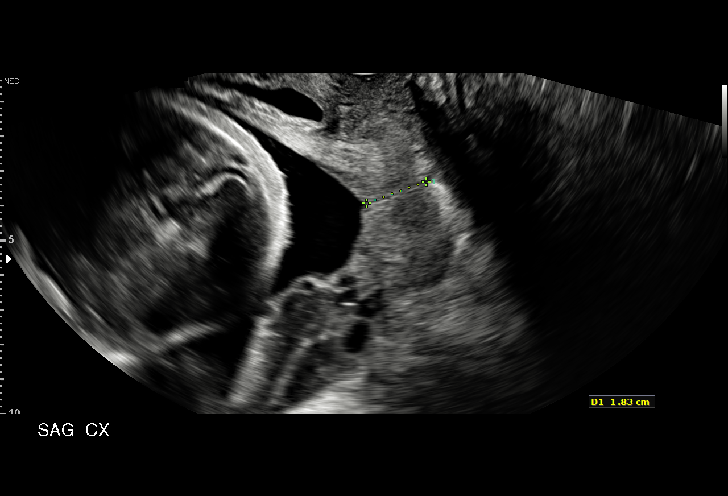
[im 40/40]
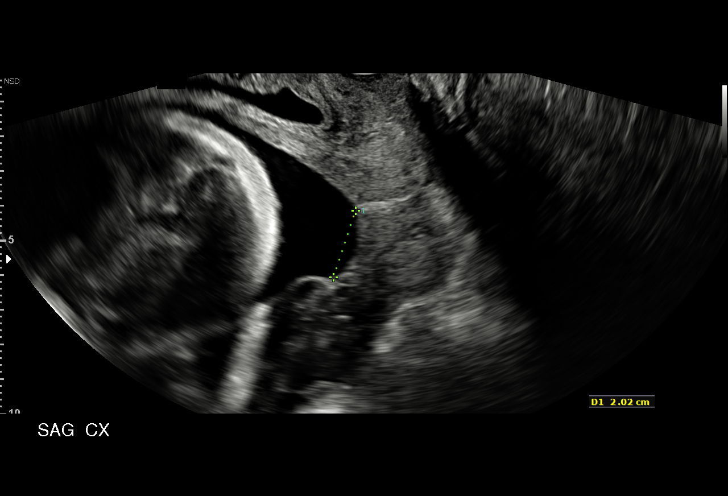

[15 of 28 positions shown; findings below may reference images not displayed]

MAU/Triage

1  TAWNYA VESTAL           [id number]      [id number]     [id number]
2  TAWNYA VESTAL           [id number]      [id number]     [id number]
Indications

29 weeks gestation of pregnancy
Vaginal bleeding in pregnancy, third trimester
OB History

Blood Type:            Height:  5'5"   Weight (lb):  188       BMI:
Gravidity:    3         Term:   1
TOP:          1        Living:  1
Fetal Evaluation

Num Of Fetuses:     1
Fetal Heart         131
Rate(bpm):
Cardiac Activity:   Observed
Presentation:       Cephalic
Placenta:           Posterior, above cervical os

Amniotic Fluid
AFI FV:      Subjectively within normal limits

AFI Sum(cm)     %Tile       Largest Pocket(cm)
17.75           67

RUQ(cm)       RLQ(cm)       LUQ(cm)        LLQ(cm)
5.95
Gestational Age

LMP:           29w 6d        Date:  05/30/17                 EDD:   03/06/18
Best:          29w 6d     Det. By:  LMP  (05/30/17)          EDD:   03/06/18
Cervix Uterus Adnexa

Cervix
Length:            1.8  cm.
Measured transvaginally.
Impression

SIUP at 29+6 weeks with cardiac activity
Cephalic presentation
Normal amniotic fluid volume
Posterior/right lateral placenta; no previa; no subchorionic
fluid collections/hemorrhage identified; inferior edge of
posterior/right lateral placenta ended 
 2.0 cms from internal
os
EV views of cervix: funneling with distal closed portion
measuring 1.8 cms
Recommendations

Follow-up as clinically indicated

## 2020-05-11 ENCOUNTER — Other Ambulatory Visit: Payer: Self-pay

## 2020-05-11 ENCOUNTER — Encounter (HOSPITAL_COMMUNITY): Payer: Self-pay

## 2020-05-11 ENCOUNTER — Ambulatory Visit (HOSPITAL_COMMUNITY)
Admission: EM | Admit: 2020-05-11 | Discharge: 2020-05-11 | Disposition: A | Discharge status: Home / Self Care | Payer: Medicaid Other | Attending: Emergency Medicine | Admitting: Emergency Medicine

## 2020-05-11 DIAGNOSIS — B9789 Other viral agents as the cause of diseases classified elsewhere: Secondary | ICD-10-CM | POA: Diagnosis not present

## 2020-05-11 DIAGNOSIS — Z20822 Contact with and (suspected) exposure to covid-19: Secondary | ICD-10-CM | POA: Diagnosis not present

## 2020-05-11 DIAGNOSIS — J988 Other specified respiratory disorders: Secondary | ICD-10-CM | POA: Diagnosis present

## 2020-05-11 MED ORDER — AEROCHAMBER PLUS MISC: 2 refills | Status: DC

## 2020-05-11 MED ORDER — IBUPROFEN 600 MG PO TABS: 600.0000 mg | ORAL_TABLET | Freq: Four times a day (QID) | ORAL | 0 refills | Status: DC | PRN

## 2020-05-11 MED ORDER — ALBUTEROL SULFATE HFA 108 (90 BASE) MCG/ACT IN AERS: 2.0000 | INHALATION_SPRAY | RESPIRATORY_TRACT | 0 refills | Status: DC | PRN

## 2020-05-11 MED ORDER — FLUTICASONE PROPIONATE 50 MCG/ACT NA SUSP: 2.0000 | Freq: Every day | NASAL | 0 refills | Status: DC

## 2020-05-11 NOTE — Discharge Instructions (Addendum)
Take 600 mg of ibuprofen combined with 1000 milligrams Tylenol together 3-4 times a day for headaches, body aches.  Flonase, saline nasal irrigation with a Lloyd Huger med rinse and distilled water as often as you want to help prevent a sinus infection.  Mucinex D for the nasal congestion.  Take 2 puffs from your albuterol inhaler using your spacer every 4-6 hours for chest tightness, shortness of breath see if this does not help.  Follow with a primary care provider of your choice, see list below.  Get a pulse oximeter.  Go to the ER if your pulse ox is below 90 to 92%.  Below is a list of primary care practices who are taking new patients for you to follow-up with.  [facility] internal medicine clinic Ground Floor - [facility], [address] [place], [place], [place] [address] [phone]  [facility] Primary Care at [facility] [address] [place], [place] [address] [phone]  [facility] [address] [place], [place] [address] [phone]  [facility] [phone] [address] [place] [address]  [facility]: [address] [place] [address]  [phone]  [facility]: [address] [place] [address]   [phone]  [facility]: [address] [place] [address]  [phone]  [facility] : [address] [place] [address] [phone]  [facility] Primary [facility]: [address] [place] [address] [phone]  [facility]: [address] [place] [address] [phone]  Dr. Oneal Grout [address] [facility] [place] [address]  [phone]  Dr. Jackie Plum, [facility]. [address] [place], [place] [address]    [phone]  Go to www.goodrx.com to look up your medications. This will give you a list of where you can find your prescriptions at the most affordable prices. Or ask the pharmacist what the cash price is, or if they have any other discount programs available to help make your medication more affordable. This can be less expensive than what you would pay with insurance.

## 2020-05-11 NOTE — ED Triage Notes (Signed)
Pt presents with complaints of 3 days of headache and shortness of breath. Concerned for COVID.

## 2020-05-11 NOTE — ED Provider Notes (Signed)
HPI  SUBJECTIVE:  Christy Holden is a 34 y.o. female who presents with 2 days of right-sided sinus headache and constant shortness of breath described as "not being able to get enough air my lungs".  This is worse with exertion.  She reports body aches, nasal congestion, postnasal drip, diarrhea.  She reports occasional wheezing, occasional seconds long migratory right-sided chest pain.  She has had an indirect exposure to Covid through her daughter.  Her boyfriend also had Covid 2 weeks ago.  She took care of him while he was ill.  She denies sore throat, loss of sense of smell or taste, cough, nausea, vomiting, abdominal pain.  She did not get the vaccine.  No calf pain, swelling, hemoptysis, surgery in the past 4 weeks, recent immobilization, exogenous estrogen.  She denies orthopnea, nocturia, PND, unintentional weight gain, lower extremity edema, abdominal pain.  She has never had shortness of breath like this before.  She is a smoker.  History of migraines.  No history of PE, DVT, pulmonary disease, CHF, MI, coronary disease, hypercholesterolemia, diabetes, hypertension.  LMP: 2 days ago.  Denies the possibility of being pregnant.  PMD: None.    Past Medical History:  Diagnosis Date  . Chlamydia   . Hydradenitis   . Migraine headache 07/11/2013  . Vaginal Pap smear, abnormal     Past Surgical History:  Procedure Laterality Date  . NO PAST SURGERIES    . TUBAL LIGATION Bilateral 02/22/2018   Procedure: POST PARTUM TUBAL LIGATION;  Surgeon: Tilda Burrow, MD;  Location: [facility] BIRTHING SUITES;  Service: Gynecology;  Laterality: Bilateral;    Family History  Problem Relation Age of Onset  . Hypertension Mother   . Hypertension Father   . Kidney disease Maternal Grandmother     Social History   Tobacco Use  . Smoking status: Current Every Day Smoker    Packs/day: 0.50    Years: 7.00    Pack years: 3.50    Types: Cigarettes  . Smokeless tobacco: Never Used  . Tobacco comment:  unable to smoke for past week r/t n/v  Substance Use Topics  . Alcohol use: Yes    Alcohol/week: 0.0 standard drinks    Comment: occasionally   . Drug use: Not Currently    Types: Marijuana    Comment: not currently    No current facility-administered medications for this encounter.  Current Outpatient Medications:  .  albuterol (VENTOLIN HFA) 108 (90 Base) MCG/ACT inhaler, Inhale 2 puffs into the lungs every 4 (four) hours as needed for wheezing or shortness of breath., Disp: 1 each, Rfl: 0 .  fluticasone (FLONASE) 50 MCG/ACT nasal spray, Place 2 sprays into both nostrils daily., Disp: 16 g, Rfl: 0 .  ibuprofen (ADVIL) 600 MG tablet, Take 1 tablet (600 mg total) by mouth every 6 (six) hours as needed., Disp: 30 tablet, Rfl: 0 .  omeprazole (PRILOSEC OTC) 20 MG tablet, Take 1 tablet (20 mg total) by mouth daily. (Patient not taking: Reported on 09/19/2018), Disp: 30 tablet, Rfl: 3 .  Prenatal Vit-Fe Fumarate-FA (PRENATAL MULTIVITAMIN) TABS tablet, Take 1 tablet by mouth daily at 12 noon., Disp: , Rfl:  .  Spacer/Aero-Holding Chambers (AEROCHAMBER PLUS) inhaler, Use as instructed, Disp: 1 each, Rfl: 2  No Known Allergies   ROS  As noted in HPI.   Physical Exam  BP 132/77   Pulse 64   Temp 98.7 F (37.1 C)   Resp 19   LMP 05/09/2020   SpO2  99%   Constitutional: Well developed, well nourished, no acute distress Eyes:  EOMI, conjunctiva normal bilaterally HENT: Normocephalic, atraumatic,mucus membranes moist.  Positive nasal congestion.  Swollen, erythematous turbinates.  No maxillary, frontal sinus tenderness.  No postnasal drip.  No cobblestoning. Respiratory: Normal inspiratory effort.  Lungs clear bilaterally.  No anterior lateral chest wall tenderness Cardiovascular: Normal rate, regular rhythm no murmurs rubs or gallops GI: nondistended skin: No rash, skin intact Musculoskeletal: Calves symmetric, nontender, no edema Neurologic: Alert & oriented x 3, no focal neuro  deficits Psychiatric: Speech and behavior appropriate   ED Course   Medications - No data to display  Orders Placed This Encounter  Procedures  . SARS CORONAVIRUS 2 (TAT 6-24 HRS) Nasopharyngeal Nasopharyngeal Swab    Standing Status:   Standing    Number of Occurrences:   1    Order Specific Question:   Is this test for diagnosis or screening    Answer:   Screening    Order Specific Question:   Symptomatic for COVID-19 as defined by CDC    Answer:   No    Order Specific Question:   Hospitalized for COVID-19    Answer:   No    Order Specific Question:   Admitted to ICU for COVID-19    Answer:   No    Order Specific Question:   Previously tested for COVID-19    Answer:   No    Order Specific Question:   Resident in a congregate (group) care setting    Answer:   No    Order Specific Question:   Employed in healthcare setting    Answer:   No    Order Specific Question:   Pregnant    Answer:   No    Order Specific Question:   Has patient completed COVID vaccination(s) (2 doses of Pfizer/Moderna 1 dose of [employer])    Answer:   No    No results found for this or any previous visit (from the past 24 hour(s)). No results found.  ED Clinical Impression  1. Viral respiratory infection   2. Encounter for laboratory testing for COVID-19 virus      ED Assessment/Plan  Covid PCR sent.  Patient PERC negative.  Her lungs are clear, she is afebrile, she is satting well on room air.  Doubt pneumonia.  Doubt CHF.  Given her other symptoms, presentation is consistent with a viral respiratory illness.   Will send home with Flonase, Mucinex D, Tylenol/ibuprofen, albuterol inhaler with a spacer for her to use every 4 hours.  Primary care list for ongoing care.  She is to buy a  pulse oximeter and go to the ED if her pulse ox gets below 92%.    Covid negative.  Discussed , MDM, treatment plan, and plan for follow-up with patient. Discussed sn/sx that should prompt return to the  ED. patient agrees with plan.   Meds ordered this encounter  Medications  . albuterol (VENTOLIN HFA) 108 (90 Base) MCG/ACT inhaler    Sig: Inhale 2 puffs into the lungs every 4 (four) hours as needed for wheezing or shortness of breath.    Dispense:  1 each    Refill:  0  . fluticasone (FLONASE) 50 MCG/ACT nasal spray    Sig: Place 2 sprays into both nostrils daily.    Dispense:  16 g    Refill:  0  . ibuprofen (ADVIL) 600 MG tablet    Sig: Take 1 tablet (  600 mg total) by mouth every 6 (six) hours as needed.    Dispense:  30 tablet    Refill:  0  . Spacer/Aero-Holding Chambers (AEROCHAMBER PLUS) inhaler    Sig: Use as instructed    Dispense:  1 each    Refill:  2    *This clinic note was created using Dragon dictation software. Therefore, there may be occasional mistakes despite careful proofreading.   ?    Domenick Gong, MD 05/13/20 0710

## 2020-05-12 LAB — SARS CORONAVIRUS 2 (TAT 6-24 HRS): SARS Coronavirus 2: NEGATIVE

## 2020-09-23 ENCOUNTER — Ambulatory Visit (HOSPITAL_COMMUNITY)
Admission: EM | Admit: 2020-09-23 | Discharge: 2020-09-23 | Disposition: A | Discharge status: Home / Self Care | Payer: Medicaid Other | Attending: Physician Assistant | Admitting: Physician Assistant

## 2020-09-23 ENCOUNTER — Other Ambulatory Visit: Payer: Self-pay

## 2020-09-23 ENCOUNTER — Encounter (HOSPITAL_COMMUNITY): Payer: Self-pay

## 2020-09-23 DIAGNOSIS — L0291 Cutaneous abscess, unspecified: Secondary | ICD-10-CM

## 2020-09-23 MED ORDER — DOXYCYCLINE HYCLATE 100 MG PO TABS: 100.0000 mg | ORAL_TABLET | Freq: Two times a day (BID) | ORAL | 0 refills | Status: DC

## 2020-09-23 NOTE — ED Triage Notes (Signed)
Pt present a abscess underneath her left breast. Pt states that the area is warm to the touch and painful with no drainage.

## 2020-09-23 NOTE — ED Provider Notes (Signed)
[facility]    CSN: [id number] Arrival date & time: 09/23/20  1107      History   Chief Complaint Chief Complaint  Patient presents with  . Abscess    Underneath left breast    HPI Christy Holden is a 35 y.o. female.   The history is provided by the patient. No language interpreter was used.  Abscess Location:  Torso Torso abscess location:  L breast Size:  10 Abscess quality: draining and redness   Progression:  Worsening Chronicity:  New Relieved by:  Nothing Worsened by:  Nothing Ineffective treatments:  None tried   Past Medical History:  Diagnosis Date  . Chlamydia   . Hydradenitis   . Migraine headache 07/11/2013  . Vaginal Pap smear, abnormal     Patient Active Problem List   Diagnosis Date Noted  . H/O tubal ligation 04/02/2018  . 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 Obesity, unspecified 06/06/2013    Past Surgical History:  Procedure Laterality Date  . NO PAST SURGERIES    . TUBAL LIGATION Bilateral 02/22/2018   Procedure: POST PARTUM TUBAL LIGATION;  Surgeon: Tilda Burrow, MD;  Location: [facility] BIRTHING SUITES;  Service: Gynecology;  Laterality: Bilateral;    OB History    Gravida  4   Para  2   Term  2   Preterm      AB  2   Living  2     SAB      IAB  2   Ectopic      Multiple  0   Live Births  2            Home Medications    Prior to Admission medications   Medication Sig Start Date End Date Taking? Authorizing Provider  doxycycline (VIBRA-TABS) 100 MG tablet Take 1 tablet (100 mg total) by mouth 2 (two) times daily. 09/23/20  Yes Cheron Schaumann K, PA-C  albuterol (VENTOLIN HFA) 108 (90 Base) MCG/ACT inhaler Inhale 2 puffs into the lungs every 4 (four) hours as needed for wheezing or shortness of breath. 05/11/20   Domenick Gong, MD  fluticasone (FLONASE) 50 MCG/ACT nasal spray Place 2 sprays into both nostrils daily. 05/11/20   Domenick Gong, MD  ibuprofen (ADVIL) 600  MG tablet Take 1 tablet (600 mg total) by mouth every 6 (six) hours as needed. 05/11/20   Domenick Gong, MD  omeprazole (PRILOSEC OTC) 20 MG tablet Take 1 tablet (20 mg total) by mouth daily. Patient not taking: Reported on 09/19/2018 02/10/18   Reva Bores, MD  Prenatal Vit-Fe Fumarate-FA (PRENATAL MULTIVITAMIN) TABS tablet Take 1 tablet by mouth daily at 12 noon.    [provider]  Spacer/Aero-Holding Chambers (AEROCHAMBER PLUS) inhaler Use as instructed 05/11/20   Domenick Gong, MD    Family History Family History  Problem Relation Age of Onset  . Hypertension Mother   . Hypertension Father   . Kidney disease Maternal Grandmother     Social History Social History   Tobacco Use  . Smoking status: Current Every Day Smoker    Packs/day: 0.50    Years: 7.00    Pack years: 3.50    Types: Cigarettes  . Smokeless tobacco: Never Used  . Tobacco comment: unable to smoke for past week r/t n/v  Substance Use Topics  . Alcohol use: Yes    Alcohol/week: 0.0 standard drinks    Comment: occasionally   . Drug use:  Not Currently    Types: Marijuana    Comment: not currently     Allergies   Patient has no known allergies.   Review of Systems Review of Systems  All other systems reviewed and are negative.    Physical Exam Triage Vital Signs ED Triage Vitals  Enc Vitals Group     BP 09/23/20 1117 125/81     Pulse Rate 09/23/20 1117 77     Resp 09/23/20 1117 16     Temp 09/23/20 1117 98.1 F (36.7 C)     Temp Source 09/23/20 1117 Oral     SpO2 09/23/20 1117 98 %     Weight --      Height --      Head Circumference --      Peak Flow --      Pain Score 09/23/20 1118 10     Pain Loc --      Pain Edu? --      Excl. in GC? --    No data found.  Updated Vital Signs BP 125/81 (BP Location: Right Arm)   Pulse 77   Temp 98.1 F (36.7 C) (Oral)   Resp 16   LMP 09/02/2020   SpO2 98%   Visual Acuity Right Eye Distance:   Left Eye Distance:   Bilateral  Distance:    Right Eye Near:   Left Eye Near:    Bilateral Near:     Physical Exam Vitals and nursing note reviewed.  Constitutional:      Appearance: She is well-developed and well-nourished.  HENT:     Head: Normocephalic.  Eyes:     Extraocular Movements: EOM normal.  Cardiovascular:     Rate and Rhythm: Normal rate.  Pulmonary:     Effort: Pulmonary effort is normal.  Abdominal:     General: There is no distension.  Musculoskeletal:        General: Normal range of motion.     Cervical back: Normal range of motion.  Skin:    Comments: 10 cm red area under left breast,  Draining pink/brown foul smelling drainage.   Neurological:     Mental Status: She is alert and oriented to person, place, and time.  Psychiatric:        Mood and Affect: Mood and affect and mood normal.      UC Treatments / Results  Labs (all labs ordered are listed, but only abnormal results are displayed) Labs Reviewed - No data to display  EKG   Radiology No results found.  Procedures Procedures (including critical care time)  Medications Ordered in UC Medications - No data to display  Initial Impression / Assessment and Plan / UC Course  I have reviewed the triage vital signs and the nursing notes.  Pertinent labs & imaging results that were available during my care of the patient were reviewed by me and considered in my medical decision making (see chart for details).     MDM:  Pt counseled on management.  Rx for doxycycline  Final Clinical Impressions(s) / UC Diagnoses   Final diagnoses:  Abscess     Discharge Instructions     Return if any problems.  Soak area 20 minutes 4 times a day   ED Prescriptions    Medication Sig Dispense Auth. Provider   doxycycline (VIBRA-TABS) 100 MG tablet Take 1 tablet (100 mg total) by mouth 2 (two) times daily. 20 tablet Elson Areas, [place]     PDMP  not reviewed this encounter.  An After Visit Summary was printed and given to the  patient.    Elson Areas, [place] 09/23/20 1137

## 2020-09-23 NOTE — Discharge Instructions (Signed)
Return if any problems.  Soak area 20 minutes 4 times a day °

## 2021-04-02 ENCOUNTER — Encounter: Payer: Self-pay | Admitting: Radiology

## 2021-04-23 ENCOUNTER — Other Ambulatory Visit: Payer: Self-pay

## 2021-04-23 ENCOUNTER — Other Ambulatory Visit (HOSPITAL_COMMUNITY)
Admission: RE | Admit: 2021-04-23 | Discharge: 2021-04-23 | Disposition: A | Discharge status: Home / Self Care | Payer: Medicaid Other | Source: Ambulatory Visit | Attending: Obstetrics and Gynecology | Admitting: Obstetrics and Gynecology

## 2021-04-23 ENCOUNTER — Ambulatory Visit (INDEPENDENT_AMBULATORY_CARE_PROVIDER_SITE_OTHER): Payer: Medicaid Other | Admitting: Obstetrics and Gynecology

## 2021-04-23 ENCOUNTER — Encounter: Payer: Self-pay | Admitting: Obstetrics and Gynecology

## 2021-04-23 VITALS — BP 120/76 | HR 80 | Ht 65.0 in | Wt 182.0 lb

## 2021-04-23 DIAGNOSIS — N871 Moderate cervical dysplasia: Secondary | ICD-10-CM | POA: Diagnosis not present

## 2021-04-23 DIAGNOSIS — Z01812 Encounter for preprocedural laboratory examination: Secondary | ICD-10-CM

## 2021-04-23 HISTORY — PX: LEEP WITH COLPOSCOPY: PRO1014

## 2021-04-23 LAB — POCT URINE PREGNANCY: Preg Test, Ur: NEGATIVE

## 2021-04-23 NOTE — Progress Notes (Signed)
Obstetrics and Gynecology New Patient Evaluation  Appointment Date: 04/23/2021  OBGYN Clinic: [facility]  Referring Provider: [facility]  Chief Complaint: LEEP procedure   History of Present Illness: Christy Holden is a 35 y.o. African-American [id number] (Patient's last menstrual period was 04/09/2021 (approximate).), seen for the above chief complaint. Her past medical history is significant for BTL.  Dysplasia history: 02/28/2021 colpo (adequate) at [facility]: 1 o'clock cin 1; 6 o'clock cin 1; 9 o'clock cin 2; ECC negative 12/11/2020 pap: ASCUS/HPV+ 04/02/2017 colpo: CIN 1>rpt pap &hpv 1 year 03/12/2017 pap: ASCUS/HPV+  Review of Systems: Pertinent items noted in HPI and remainder of comprehensive ROS otherwise negative.   Patient Active Problem List   Diagnosis Date Noted   Dysplasia of cervix, high grade CIN 2 04/23/2021   Obesity, unspecified 06/06/2013    Past Medical History:  Past Medical History:  Diagnosis Date   Chlamydia    Hydradenitis    Migraine headache 07/11/2013   Vaginal Pap smear, abnormal     Past Surgical History:  Past Surgical History:  Procedure Laterality Date   TUBAL LIGATION Bilateral 02/22/2018   Procedure: POST PARTUM TUBAL LIGATION;  Surgeon: Tilda Burrow, MD;  Location: [facility] BIRTHING SUITES;  Service: Gynecology;  Laterality: Bilateral;    Past Obstetrical History:  OB History  Gravida Para Term Preterm AB Living  4 2 2   2 2   SAB IAB Ectopic Multiple Live Births    2   0 2    # Outcome Date GA Lbr Len/2nd Weight Sex Delivery Anes PTL Lv  4 Term 02/22/18 [redacted]w[redacted]d 02:46 / 00:08 7 lb 10.8 oz (3.48 kg) M Vag-Spont None  LIV  3 Term 10/01/06   5 lb (2.268 kg) F Vag-Spont   LIV  2 IAB           1 IAB             Past Gynecological History: As per HPI. Periods: qmonth, regular  Social History:  Social History   Socioeconomic History   Marital status: Single    Spouse name: Not on file    Number of children: Not on file   Years of education: Not on file   Highest education level: Not on file  Occupational History   Not on file  Tobacco Use   Smoking status: Every Day    Packs/day: 0.50    Years: 7.00    Pack years: 3.50    Types: Cigarettes   Smokeless tobacco: Never   Tobacco comments:    unable to smoke for past week r/t n/v  Substance and Sexual Activity   Alcohol use: Yes    Alcohol/week: 0.0 standard drinks    Comment: occasionally    Drug use: Not Currently    Types: Marijuana    Comment: not currently   Sexual activity: Yes    Birth control/protection: Surgical  Other Topics Concern   Not on file  Social History Narrative   Not on file   Social Determinants of Health   Financial Resource Strain: Not on file  Food Insecurity: Not on file  Transportation Needs: Not on file  Physical Activity: Not on file  Stress: Not on file  Social Connections: Not on file  Intimate Partner Violence: Not on file    Family History:  Family History  Problem Relation Age of Onset   Hypertension Mother    Hypertension Father    Kidney disease Maternal Grandmother  Medications [name] had no medications administered during this visit. Current Outpatient Medications  Medication Sig Dispense Refill   doxycycline (VIBRA-TABS) 100 MG tablet Take 1 tablet (100 mg total) by mouth 2 (two) times daily. 20 tablet 0   albuterol (VENTOLIN HFA) 108 (90 Base) MCG/ACT inhaler Inhale 2 puffs into the lungs every 4 (four) hours as needed for wheezing or shortness of breath. (Patient not taking: Reported on 04/23/2021) 1 each 0   fluticasone (FLONASE) 50 MCG/ACT nasal spray Place 2 sprays into both nostrils daily. (Patient not taking: Reported on 04/23/2021) 16 g 0   ibuprofen (ADVIL) 600 MG tablet Take 1 tablet (600 mg total) by mouth every 6 (six) hours as needed. (Patient not taking: Reported on 04/23/2021) 30 tablet 0   omeprazole (PRILOSEC OTC) 20 MG tablet Take  1 tablet (20 mg total) by mouth daily. (Patient not taking: Reported on 09/19/2018) 30 tablet 3   Prenatal Vit-Fe Fumarate-FA (PRENATAL MULTIVITAMIN) TABS tablet Take 1 tablet by mouth daily at 12 noon.     Spacer/Aero-Holding Chambers (AEROCHAMBER PLUS) inhaler Use as instructed (Patient not taking: Reported on 04/23/2021) 1 each 2   No current facility-administered medications for this visit.    Allergies Patient has no known allergies.   Physical Exam:  BP 120/76 (BP Location: Left Arm, Patient Position: Sitting, Cuff Size: Normal)   Pulse 80   Ht 5\' 5"  (1.651 m)   Wt 182 lb (82.6 kg)   LMP 04/09/2021 (Approximate)   BMI 30.29 kg/m  Body mass index is 30.29 kg/m. General appearance: Well nourished, well developed female in no acute distress.  Neck:  Supple, normal appearance, and no thyromegaly  Cardiovascular: normal s1 and s2.  No murmurs, rubs or gallops. Respiratory:  Clear to auscultation bilateral. Normal respiratory effort Abdomen: positive bowel sounds and no masses, hernias; diffusely non tender to palpation, non distendede Neuro/Psych:  Normal mood and affect.  Skin:  Warm and dry.  Lymphatic:  No inguinal lymphadenopathy.   Pelvic exam: is not limited by body habitus EGBUS: within normal limits Vagina: within normal limits and with no blood or discharge in the vault Cervix: normal appearing cervix without tenderness, discharge or lesions. Uterus:  nonenlarged and non tender Adnexa:  normal adnexa and no mass, fullness, tenderness Rectovaginal: deferred  See procedure note for LEEP and ECC  Laboratory: UPT negative  Radiology: none  Assessment: pt stable  Plan:  1. Dysplasia of cervix, high grade CIN 2 I d/w her re: her abnormal pap smears an colpo and I recommend intervention. I d/w her re: cryo and LEEP. She is certain she is done with having children, so I told her I recommend a LEEP which was done today - Surgical pathology( [facility]) -  POCT urine pregnancy  RTC 8m for pelvic exam  0m MD Attending Center for [facility])

## 2021-04-23 NOTE — Procedures (Signed)
Loop Electrosurgical Excisional and Colposcopy Procedure Note  Pre-operative Diagnosis:  02/28/2021 colpo (adequate) at [facility]: 1 o'clock cin 1; 6 o'clock cin 1; 9 o'clock cin 2; ECC negative 12/11/2020 pap: ASCUS/HPV+ 04/02/2017 colpo: CIN 1>rpt pap &hpv 1 year 03/12/2017 pap: ASCUS/HPV+  Post-operative Diagnosis: CIN 2  Procedure Details  Urine pregnancy test: negative   The risks (including infection, bleeding, pain, preterm birth, cervical stenosis) and benefits of the procedure were explained to the patient and written informed consent was obtained.  The patient was placed in the dorsal lithotomy position. A coated Graves was speculum inserted in the vagina, and the cervix was visualized.  A colposcopy was performed with acetic acid and lugol's staining, with the below noted findings. The cervical stromal bed was injected with 59mL of 1% lidocaine with epinephrine. Entering at 12 o'clock on the cervix and using a medium Fischer Loop and a setting of 50/50 blend current, a LEEP was done, in one circumferential fashion.  I did a top hat LEEP from 3 to 6 o'clock on the edge of the LEEP bed and trying to include the untouched cervix adjacent to it.  Next, an ECC was done and hemostasis achieved with the ball electrode on 50 coagulation current and application of Monsel's. On coagulation, the entire LEEP bed and surgical margins were thoroughly cauterized  Findings: diffuse AWE changes with increased mosaicism on the posterior lip   Adequate: Yes  Specimens: LEEP specimen (circumferential, 360 degrees), top LEEP segment (3-6 o'clock) and ECC   Condition: Stable  Complications: None  Plan: The patient was advised to call for any fever or for prolonged or severe pain or bleeding. She was advised to use OTC analgesics as needed for mild to moderate pain. Pelvic rest was advised until after her four week post operative visit.    Cornelia Copa MD Attending Center for [employer]  Midwife)

## 2021-04-26 LAB — SURGICAL PATHOLOGY

## 2021-05-28 ENCOUNTER — Ambulatory Visit (INDEPENDENT_AMBULATORY_CARE_PROVIDER_SITE_OTHER): Payer: Medicaid Other | Admitting: Obstetrics and Gynecology

## 2021-05-28 ENCOUNTER — Other Ambulatory Visit: Payer: Self-pay

## 2021-05-28 VITALS — BP 134/77 | HR 76

## 2021-05-28 DIAGNOSIS — Z9889 Other specified postprocedural states: Secondary | ICD-10-CM

## 2021-05-28 DIAGNOSIS — N871 Moderate cervical dysplasia: Secondary | ICD-10-CM | POA: Diagnosis not present

## 2021-05-28 NOTE — Progress Notes (Signed)
Obstetrics and Gynecology Visit Return Patient Evaluation  Appointment Date: 05/28/2021  OBGYN Clinic: Center for [facility]  Chief Complaint: f/u LEEP  History of Present Illness:  Christy Holden is a 35 y.o. s/p 8/22 LEEP Prior history showed: 02/28/2021 colpo (adequate) at [facility]: 1 o'clock cin 1; 6 o'clock cin 1; 9 o'clock cin 2; ECC negative 12/11/2020 pap: ASCUS/HPV+ 04/02/2017 colpo: CIN 1>rpt pap &hpv 1 year 03/12/2017 pap: ASCUS/HPV+   LEEP pathology showed:  SURGICAL PATHOLOGY SURGICAL PATHOLOGY  CASE: [phone]  PATIENT: Christy Holden  Surgical Pathology Report      Clinical History: CIN 2 on colpo (lp)      FINAL MICROSCOPIC DIAGNOSIS:   A. CERVIX, LEEP:  - High-grade squamous intraepithelial lesion (CIN2, high grade  dysplasia) involving 12-3 o'clock quadrant  - All margins are negative for high-grade dysplasia  - Ectocervical margin in all quadrants is positive for low-grade  squamous intraepithelial lesion (CIN1, low grade dysplasia)   B. CERVIX, THREE TO SIX O'CLOCK, BIOPSY:  - Low-grade squamous intraepithelial lesion (CIN1, low grade dysplasia)   C. ENDOCERVIX, CURETTAGE:  - Benign cervical/ lower uterine segment mucosa  - Negative for dysplasia or malignancy    Review of Systems: as noted in the History of Present Illness.  Patient Active Problem List   Diagnosis Date Noted   Dysplasia of cervix, high grade CIN 2 04/23/2021   Obesity, unspecified 06/06/2013   Medications:  [name] had no medications administered during this visit. Current Outpatient Medications  Medication Sig Dispense Refill   albuterol (VENTOLIN HFA) 108 (90 Base) MCG/ACT inhaler Inhale 2 puffs into the lungs every 4 (four) hours as needed for wheezing or shortness of breath. (Patient not taking: Reported on 04/23/2021) 1 each 0   doxycycline (VIBRA-TABS) 100 MG tablet Take 1 tablet (100 mg total) by mouth 2 (two) times daily. 20 tablet 0    fluticasone (FLONASE) 50 MCG/ACT nasal spray Place 2 sprays into both nostrils daily. (Patient not taking: Reported on 04/23/2021) 16 g 0   ibuprofen (ADVIL) 600 MG tablet Take 1 tablet (600 mg total) by mouth every 6 (six) hours as needed. (Patient not taking: Reported on 04/23/2021) 30 tablet 0   omeprazole (PRILOSEC OTC) 20 MG tablet Take 1 tablet (20 mg total) by mouth daily. (Patient not taking: Reported on 09/19/2018) 30 tablet 3   Prenatal Vit-Fe Fumarate-FA (PRENATAL MULTIVITAMIN) TABS tablet Take 1 tablet by mouth daily at 12 noon.     Spacer/Aero-Holding Chambers (AEROCHAMBER PLUS) inhaler Use as instructed (Patient not taking: Reported on 04/23/2021) 1 each 2   No current facility-administered medications for this visit.    Allergies: has No Known Allergies.  Physical Exam:  BP 134/77   Pulse 76  There is no height or weight on file to calculate BMI. General appearance: Well nourished, well developed female in no acute distress.  Neuro/Psych:  Normal mood and affect.    Pelvic exam:  EGBUS: normal Vaginal vault: nomrla Cervix:  completely healed, nttp, passes cytobrush easily  Assessment: pt stable   Plan:  1. H/O LEEP Well healed. D/w her re: need for 4m rpt pap/hpv and ecc due to +cin1 on margins. Pt amenable to plan  Cornelia Copa MD Attending Center for [facility])

## 2021-10-06 ENCOUNTER — Encounter: Payer: Self-pay | Admitting: Radiology

## 2022-07-08 ENCOUNTER — Emergency Department (HOSPITAL_COMMUNITY)
Admission: EM | Admit: 2022-07-08 | Discharge: 2022-07-08 | Discharge status: Against Medical Advice | Payer: Medicaid Other | Attending: Emergency Medicine | Admitting: Emergency Medicine

## 2022-07-08 DIAGNOSIS — L02412 Cutaneous abscess of left axilla: Secondary | ICD-10-CM | POA: Diagnosis present

## 2022-07-08 DIAGNOSIS — Z5329 Procedure and treatment not carried out because of patient's decision for other reasons: Secondary | ICD-10-CM | POA: Diagnosis not present

## 2022-07-08 DIAGNOSIS — L02419 Cutaneous abscess of limb, unspecified: Secondary | ICD-10-CM

## 2022-07-08 MED ORDER — DOXYCYCLINE HYCLATE 100 MG PO CAPS: 100.0000 mg | ORAL_CAPSULE | Freq: Two times a day (BID) | ORAL | 0 refills | Status: DC

## 2022-07-08 MED ORDER — TRAMADOL HCL 50 MG PO TABS: 50.0000 mg | ORAL_TABLET | Freq: Four times a day (QID) | ORAL | 0 refills | Status: DC | PRN

## 2022-07-08 NOTE — Discharge Instructions (Addendum)
Try drawing salve Get help right away if you: Have severe pain. See red streaks on your skin spreading away from the abscess. See redness that spreads quickly. Have a fever or chills

## 2022-07-08 NOTE — ED Provider Triage Note (Signed)
Emergency Medicine Provider Triage Evaluation Note  Christy Holden , a 36 y.o. female  was evaluated in triage.  Pt complains of boil. Noticed the lesion a couple of weeks ago. Located under the left arm pit. Lesion is tender and painful at times. Not oozing or bleeding. States she has had an abscess in this same location which has required draining and antibiotics. Denies fevers. Thinks she might have HS.   Review of Systems  Positive: See above Negative: See above  Physical Exam  BP 139/79   Pulse (!) 129   Temp 98.2 F (36.8 C) (Oral)   Resp 16   SpO2 98%  Gen:   Awake, no distress   Resp:  Normal effort  MSK:   Moves extremities without difficulty  Other:  Fluctuant lesion about the left axilla. It is tender to touch. Not oozing or bleeding  Medical Decision Making  Medically screening exam initiated at 9:50 AM.  Appropriate orders placed.  Christy Holden was informed that the remainder of the evaluation will be completed by another provider, this initial triage assessment does not replace that evaluation, and the importance of remaining in the ED until their evaluation is complete.    Harriet Pho, PA-C 07/08/22 [phone]

## 2022-07-08 NOTE — ED Provider Notes (Signed)
[place] EMERGENCY DEPARTMENT Provider Note   CSN: [id number] Arrival date & time: 07/08/22  8315     History  No chief complaint on file.   Christy Holden is a 36 y.o. female    Abscess Abscess location: L axilla. Abscess quality: fluctuance, painful, redness and warmth   Red streaking: no   Duration:  2 weeks Progression:  Improving (was diffusely swollen, now localized, pain is worse) Pain details:    Quality:  Aching   Severity:  Moderate   Duration:  2 weeks   Timing:  Constant Chronicity:  Recurrent Relieved by:  Nothing Worsened by:  Nothing Ineffective treatments:  Draining/squeezing and warm compresses Associated symptoms: no anorexia, no fatigue, no fever, no headaches, no nausea and no vomiting        Home Medications Prior to Admission medications   Medication Sig Start Date End Date Taking? Authorizing Provider  albuterol (VENTOLIN HFA) 108 (90 Base) MCG/ACT inhaler Inhale 2 puffs into the lungs every 4 (four) hours as needed for wheezing or shortness of breath. Patient not taking: Reported on 04/23/2021 05/11/20   Melynda Ripple, MD  doxycycline (VIBRA-TABS) 100 MG tablet Take 1 tablet (100 mg total) by mouth 2 (two) times daily. 09/23/20   Fransico Meadow, PA-C  fluticasone (FLONASE) 50 MCG/ACT nasal spray Place 2 sprays into both nostrils daily. Patient not taking: Reported on 04/23/2021 05/11/20   Melynda Ripple, MD  ibuprofen (ADVIL) 600 MG tablet Take 1 tablet (600 mg total) by mouth every 6 (six) hours as needed. Patient not taking: Reported on 04/23/2021 05/11/20   Melynda Ripple, MD  omeprazole (PRILOSEC OTC) 20 MG tablet Take 1 tablet (20 mg total) by mouth daily. Patient not taking: Reported on 09/19/2018 02/10/18   Donnamae Jude, MD  Prenatal Vit-Fe Fumarate-FA (PRENATAL MULTIVITAMIN) TABS tablet Take 1 tablet by mouth daily at 12 noon.    [provider]  Spacer/Aero-Holding Chambers (AEROCHAMBER PLUS) inhaler Use  as instructed Patient not taking: Reported on 04/23/2021 05/11/20   Melynda Ripple, MD      Allergies    Patient has no known allergies.    Review of Systems   Review of Systems  Constitutional:  Negative for fatigue and fever.  Gastrointestinal:  Negative for anorexia, nausea and vomiting.  Neurological:  Negative for headaches.    Physical Exam Updated Vital Signs BP 139/79   Pulse (!) 129   Temp 98.2 F (36.8 C) (Oral)   Resp 16   SpO2 98%  Physical Exam Vitals and nursing note reviewed.  Constitutional:      General: She is not in acute distress.    Appearance: She is well-developed. She is not diaphoretic.  HENT:     Head: Normocephalic and atraumatic.     Right Ear: External ear normal.     Left Ear: External ear normal.     Nose: Nose normal.     Mouth/Throat:     Mouth: Mucous membranes are moist.  Eyes:     General: No scleral icterus.    Conjunctiva/sclera: Conjunctivae normal.  Cardiovascular:     Rate and Rhythm: Normal rate and regular rhythm.     Heart sounds: Normal heart sounds. No murmur heard.    No friction rub. No gallop.  Pulmonary:     Effort: Pulmonary effort is normal. No respiratory distress.     Breath sounds: Normal breath sounds.  Abdominal:     General: Bowel sounds are normal. There  is no distension.     Palpations: Abdomen is soft. There is no mass.     Tenderness: There is no abdominal tenderness. There is no guarding.  Musculoskeletal:     Cervical back: Normal range of motion.  Skin:    General: Skin is warm and dry.     Comments: Left axillary abscess, Fluctuant. Minimally tender  Neurological:     Mental Status: She is alert and oriented to person, place, and time.  Psychiatric:        Behavior: Behavior normal.     ED Results / Procedures / Treatments   Labs (all labs ordered are listed, but only abnormal results are displayed) Labs Reviewed - No data to display  EKG None  Radiology No results  found.  Procedures Procedures    Medications Ordered in ED Medications - No data to display  ED Course/ Medical Decision Making/ A&P                           Medical Decision Making Risk Prescription drug management.   Patient wishes to be d/c with abx. Christy Holden presents with abscess. There is no area of retained pus after procedure. The presentation of Christy Holden is NOT consistent with necrotizing fascitis or osteomyolitis. There is no evidence of retained foreign body, neurovascular or tendon injury. The presentation of Christy Holden is NOT consistent with sepsis and/or bacteremia. Christy Holden meets outpatient criteria for treatment with abx and is sent home on empiric antibiotics covering the relevant bacteria.  Strict return and follow-up precautions have been given by me personally or by detailed written instructions verbalized by nursing staff using the teach back method to the patient/family/caregiver(s).  Data Reviewed/Counseling: I have reviewed the patient's vital signs, nursing notes, and other relevant tests/information. I had a detailed discussion regarding the historical points, exam findings, and any diagnostic results supporting the discharge diagnosis. I also discussed the need for outpatient follow-up and the need to return to the ED if symptoms worsen or if there are any questions or concerns that arise at home.   Final Clinical Impression(s) / ED Diagnoses Final diagnoses:  None    Rx / DC Orders ED Discharge Orders     None         Arthor Captain, PA-C 07/08/22 1102    Gerhard Munch, MD 07/08/22 1449

## 2022-07-08 NOTE — ED Triage Notes (Signed)
Pt here from home with an abscess to the left armpit times 2 weeks drained some but still hurts

## 2022-09-25 ENCOUNTER — Ambulatory Visit (HOSPITAL_COMMUNITY)
Admission: EM | Admit: 2022-09-25 | Discharge: 2022-09-25 | Disposition: A | Discharge status: Home / Self Care | Payer: Medicaid Other | Attending: Family Medicine | Admitting: Family Medicine

## 2022-09-25 ENCOUNTER — Encounter (HOSPITAL_COMMUNITY): Payer: Self-pay

## 2022-09-25 DIAGNOSIS — K529 Noninfective gastroenteritis and colitis, unspecified: Secondary | ICD-10-CM

## 2022-09-25 MED ORDER — ONDANSETRON 4 MG PO TBDP
4.0000 mg | ORAL_TABLET | Freq: Once | ORAL | Status: AC
  Administered 2022-09-25: 4 mg via ORAL

## 2022-09-25 MED ORDER — ONDANSETRON 4 MG PO TBDP
ORAL_TABLET | ORAL | Status: AC
  Filled 2022-09-25: qty 1

## 2022-09-25 MED ORDER — ONDANSETRON 4 MG PO TBDP: 4.0000 mg | ORAL_TABLET | Freq: Three times a day (TID) | ORAL | 0 refills | Status: DC | PRN

## 2022-09-25 NOTE — Discharge Instructions (Addendum)
Ondansetron dissolved in the mouth every 8 hours as needed for nausea or vomiting. Clear liquids and bland things to eat. We gave you 1 dose of ondansetron here in the clinic

## 2022-09-25 NOTE — ED Provider Notes (Signed)
[place]    CSN: [id number] Arrival date & time: 09/25/22  [place]      History   Chief Complaint Chief Complaint  Patient presents with   Emesis   Abdominal Pain    HPI Christy Holden is a 37 y.o. female.    Emesis Associated symptoms: abdominal pain   Abdominal Pain Associated symptoms: vomiting    Here for nausea and vomiting and diarrhea.  Symptoms began for her this morning.  She is thrown up 3-4 times today but that is improved now this evening.  She is still nauseated some.  She has had some loose stools and over-the-counter diarrhea medicine helped.  No fever.  She has had a little bit of abdominal cramping and then she had hurts some in her back, but that is improved also  Her menstrual cycle started yesterday.  No dysuria. Past Medical History:  Diagnosis Date   Chlamydia    Hydradenitis    Migraine headache 07/11/2013   Vaginal Pap smear, abnormal     Patient Active Problem List   Diagnosis Date Noted   Dysplasia of cervix, high grade CIN 2 04/23/2021   Obesity, unspecified 06/06/2013    Past Surgical History:  Procedure Laterality Date   LEEP WITH COLPOSCOPY  04/23/2021   TUBAL LIGATION Bilateral 02/22/2018   Procedure: POST PARTUM TUBAL LIGATION;  Surgeon: Jonnie Kind, MD;  Location: [place];  Service: Gynecology;  Laterality: Bilateral;    OB History     Gravida  4   Para  2   Term  2   Preterm      AB  2   Living  2      SAB      IAB  2   Ectopic      Multiple  0   Live Births  2            Home Medications    Prior to Admission medications   Medication Sig Start Date End Date Taking? Authorizing Provider  ondansetron (ZOFRAN-ODT) 4 MG disintegrating tablet Take 1 tablet (4 mg total) by mouth every 8 (eight) hours as needed for nausea or vomiting. 09/25/22  Yes Barrett Henle, MD    Family History Family History  Problem Relation Age of Onset   Hypertension Mother    Hypertension  Father    Kidney disease Maternal Grandmother     Social History Social History   Tobacco Use   Smoking status: Every Day    Packs/day: 0.50    Years: 7.00    Total pack years: 3.50    Types: Cigarettes   Smokeless tobacco: Never   Tobacco comments:    unable to smoke for past week r/t n/v  Substance Use Topics   Alcohol use: Yes    Alcohol/week: 0.0 standard drinks of alcohol    Comment: occasionally    Drug use: Not Currently    Types: Marijuana    Comment: not currently     Allergies   Patient has no known allergies.   Review of Systems Review of Systems  Gastrointestinal:  Positive for abdominal pain and vomiting.     Physical Exam Triage Vital Signs ED Triage Vitals  Enc Vitals Group     BP 09/25/22 2004 124/85     Pulse Rate 09/25/22 2004 79     Resp 09/25/22 2004 12     Temp 09/25/22 2004 98.8 F (37.1 C)     Temp  Source 09/25/22 2004 Oral     SpO2 09/25/22 2004 100 %     Weight --      Height --      Head Circumference --      Peak Flow --      Pain Score 09/25/22 2001 4     Pain Loc --      Pain Edu? --      Excl. in [place]? --    No data found.  Updated Vital Signs BP 124/85 (BP Location: Right Arm)   Pulse 79   Temp 98.8 F (37.1 C) (Oral)   Resp 12   SpO2 100%   Breastfeeding No   Visual Acuity Right Eye Distance:   Left Eye Distance:   Bilateral Distance:    Right Eye Near:   Left Eye Near:    Bilateral Near:     Physical Exam Vitals reviewed.  Constitutional:      General: She is not in acute distress.    Appearance: She is not ill-appearing, toxic-appearing or diaphoretic.  HENT:     Mouth/Throat:     Mouth: Mucous membranes are moist.  Eyes:     Extraocular Movements: Extraocular movements intact.     Pupils: Pupils are equal, round, and reactive to light.  Cardiovascular:     Rate and Rhythm: Normal rate and regular rhythm.     Heart sounds: No murmur heard. Pulmonary:     Effort: Pulmonary effort is normal.      Breath sounds: Normal breath sounds.  Abdominal:     General: There is no distension.     Palpations: Abdomen is soft. There is no mass.     Tenderness: There is no abdominal tenderness. There is no guarding.  Musculoskeletal:     Cervical back: Neck supple.  Lymphadenopathy:     Cervical: No cervical adenopathy.  Skin:    Capillary Refill: Capillary refill takes less than 2 seconds.     Coloration: Skin is not jaundiced or pale.  Neurological:     General: No focal deficit present.     Mental Status: She is alert.  Psychiatric:        Behavior: Behavior normal.      UC Treatments / Results  Labs (all labs ordered are listed, but only abnormal results are displayed) Labs Reviewed - No data to display  EKG   Radiology No results found.  Procedures Procedures (including critical care time)  Medications Ordered in UC Medications  ondansetron (ZOFRAN-ODT) disintegrating tablet 4 mg (has no administration in time range)    Initial Impression / Assessment and Plan / UC Course  I have reviewed the triage vital signs and the nursing notes.  Pertinent labs & imaging results that were available during my care of the patient were reviewed by me and considered in my medical decision making (see chart for details).        Think she has a gastroenteritis that is already starting to improve.  She is given a dose of Zofran here as she wants her prescription to go to [facility] which is already closed.  Clear liquids and a bland diet.  Work note is provided Final Clinical Impressions(s) / UC Diagnoses   Final diagnoses:  Gastroenteritis     Discharge Instructions      Ondansetron dissolved in the mouth every 8 hours as needed for nausea or vomiting. Clear liquids and bland things to eat. We gave you 1 dose of ondansetron here in  the clinic     ED Prescriptions     Medication Sig Dispense Auth. Provider   ondansetron (ZOFRAN-ODT) 4 MG disintegrating tablet Take 1  tablet (4 mg total) by mouth every 8 (eight) hours as needed for nausea or vomiting. 10 tablet Marlinda Mike Janace Aris, MD      PDMP not reviewed this encounter.   Zenia Resides, MD 09/25/22 2019

## 2022-09-25 NOTE — ED Triage Notes (Signed)
Pt is here for abdominal pain , back pain , diarrhea and vomiting, chills  x1day

## 2022-11-18 ENCOUNTER — Telehealth: Payer: Self-pay

## 2022-11-18 NOTE — Telephone Encounter (Signed)
Left message for pt to call office back regarding annual / pap appt requested by Dr. Ilda Basset

## 2024-04-08 ENCOUNTER — Ambulatory Visit (HOSPITAL_COMMUNITY)
Admission: EM | Admit: 2024-04-08 | Discharge: 2024-04-08 | Disposition: A | Discharge status: Home / Self Care | Attending: Family Medicine | Admitting: Family Medicine

## 2024-04-08 ENCOUNTER — Other Ambulatory Visit: Payer: Self-pay

## 2024-04-08 ENCOUNTER — Encounter (HOSPITAL_COMMUNITY): Payer: Self-pay | Admitting: Emergency Medicine

## 2024-04-08 DIAGNOSIS — G8929 Other chronic pain: Secondary | ICD-10-CM | POA: Diagnosis not present

## 2024-04-08 DIAGNOSIS — M25561 Pain in right knee: Secondary | ICD-10-CM

## 2024-04-08 MED ORDER — NAPROXEN 500 MG PO TABS: 500.0000 mg | ORAL_TABLET | Freq: Two times a day (BID) | ORAL | 0 refills | Status: AC

## 2024-04-08 NOTE — ED Notes (Signed)
 Patient was given instructions on application

## 2024-04-08 NOTE — ED Provider Notes (Signed)
 [facility]    CSN: [id number] Arrival date & time: 04/08/24  1526      History   Chief Complaint Chief Complaint  Patient presents with   Knee Pain    HPI Christy Holden is a 38 y.o. female.    Knee Pain  Patient is here for right medial knee pain.  She has noted pain for the last year, but has gotten more prevalent over time.   Pain with flexion/extension of the knee.  No swelling.  No known injury.  She may take tylenol  prn but not really helping.         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 Authorizing Provider  ondansetron  (ZOFRAN -ODT) 4 MG disintegrating tablet Take 1 tablet (4 mg total) by mouth every 8 (eight) hours as needed for nausea or vomiting. 09/25/22   Vonna Sharlet POUR, MD    Family History Family History  Problem Relation Age of Onset   Hypertension Mother    Hypertension Father    Kidney disease Maternal Grandmother     Social History Social History   Tobacco Use   Smoking status: Every Day    Current packs/day: 0.50    Average packs/day: 0.5 packs/day for 7.0 years (3.5 ttl pk-yrs)    Types: Cigarettes   Smokeless tobacco: Never    Tobacco comments:    unable to smoke for past week r/t n/v  Vaping Use   Vaping status: Never Used  Substance Use Topics   Alcohol use: Yes    Alcohol/week: 0.0 standard drinks of alcohol    Comment: occasionally    Drug use: Yes    Types: Marijuana     Allergies   Patient has no known allergies.   Review of Systems Review of Systems  Constitutional: Negative.   HENT: Negative.    Respiratory: Negative.    Cardiovascular: Negative.   Gastrointestinal: Negative.      Physical Exam Triage Vital Signs ED Triage Vitals  Encounter Vitals Group     BP 04/08/24 1544 138/84     Girls Systolic BP Percentile --      Girls Diastolic BP Percentile --      Boys Systolic BP Percentile --      Boys Diastolic BP Percentile --      Pulse Rate 04/08/24 1544 65     Resp 04/08/24 1544 18  Temp 04/08/24 1544 98.5 F (36.9 C)     Temp Source 04/08/24 1544 Oral     SpO2 04/08/24 1544 98 %     Weight --      Height --      Head Circumference --      Peak Flow --      Pain Score 04/08/24 1542 8     Pain Loc --      Pain Education --      Exclude from Growth Chart --    No data found.  Updated Vital Signs BP 138/84 (BP Location: Right Arm)   Pulse 65   Temp 98.5 F (36.9 C) (Oral)   Resp 18   LMP 04/03/2024   SpO2 98%   Visual Acuity Right Eye Distance:   Left Eye Distance:   Bilateral Distance:    Right Eye Near:   Left Eye Near:    Bilateral Near:     Physical Exam Constitutional:      Appearance: Normal appearance. She is normal weight.  Musculoskeletal:     Comments: No obvious swelling or deformity to the right knee;  She has TTP to the right medial knee, just medial to the knee cap;  slight TTP along the joint line as well;  no pain with passive movement;  pain with full flexion of the knee  Neurological:     General: No focal deficit present.     Mental Status: She is alert.      UC Treatments / Results  Labs (all labs ordered are listed, but only  abnormal results are displayed) Labs Reviewed - No data to display  EKG   Radiology No results found.  Procedures Procedures (including critical care time)  Medications Ordered in UC Medications - No data to display  Initial Impression / Assessment and Plan / UC Course  I have reviewed the triage vital signs and the nursing notes.  Pertinent labs & imaging results that were available during my care of the patient were reviewed by me and considered in my medical decision making (see chart for details).   Final Clinical Impressions(s) / UC Diagnoses   Final diagnoses:  Chronic pain of right knee     Discharge Instructions      You were seen today for knee pain.  I think this is likely a tendonitis issue.  I have given you home exercises for this.  I have sent a script to your pharmacy to help with pain and inflammation.  I have given you a knee brace today.   If you are not improving you may need to follow up with your primary care provider, or be seen by an orthopedist.     ED Prescriptions     Medication Sig Dispense Auth. Provider   naproxen  (NAPROSYN ) 500 MG tablet Take 1 tablet (500 mg total) by mouth 2 (two) times daily. 30 tablet Darral Longs, MD      PDMP not reviewed this encounter.   Darral Longs, MD 04/08/24 [phone]

## 2024-04-08 NOTE — ED Triage Notes (Signed)
 Pain to inner right knee.  Very specific about location of pain.  Notices pain with flexion and extension of right knee.  No known injury.  Standing, straight legged, there is no pain.    Has had tylenol  intermittently.   Pain first noticed over a year ago.  Pain once was intermittent and only noticed when working a lot of shifts.

## 2024-04-08 NOTE — Discharge Instructions (Addendum)
 You were seen today for knee pain.  I think this is likely a tendonitis issue.  I have given you home exercises for this.  I have sent a script to your pharmacy to help with pain and inflammation.  I have given you a knee brace today.   If you are not improving you may need to follow up with your primary care provider, or be seen by an orthopedist.
